# Patient Record
Sex: Female | Born: 2000 | Hispanic: No | Marital: Single | Smoking: Never smoker
Health system: Southern US, Community
[De-identification: ages and names within clinical notes are randomized; demographics above are authoritative.]

## PROBLEM LIST (undated history)

## (undated) DIAGNOSIS — R5600 Simple febrile convulsions: Secondary | ICD-10-CM

## (undated) DIAGNOSIS — E079 Disorder of thyroid, unspecified: Secondary | ICD-10-CM

## (undated) HISTORY — PX: WISDOM TOOTH EXTRACTION: SHX21

---
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
## 2021-01-07 ENCOUNTER — Other Ambulatory Visit: Payer: Self-pay | Admitting: Physician Assistant

## 2021-01-25 ENCOUNTER — Encounter: Payer: Self-pay | Admitting: Family Medicine

## 2021-01-25 ENCOUNTER — Other Ambulatory Visit: Payer: Self-pay

## 2021-01-25 ENCOUNTER — Ambulatory Visit (INDEPENDENT_AMBULATORY_CARE_PROVIDER_SITE_OTHER): Payer: Medicaid Other | Admitting: Family Medicine

## 2021-01-25 VITALS — BP 96/58 | HR 81 | Ht 60.0 in | Wt 114.0 lb

## 2021-01-25 DIAGNOSIS — G44209 Tension-type headache, unspecified, not intractable: Secondary | ICD-10-CM

## 2021-01-25 HISTORY — DX: Tension-type headache, unspecified, not intractable: G44.209

## 2021-01-25 NOTE — Patient Instructions (Signed)
Thank you for coming to see me today. It was a pleasure. Today we talked about:   You should get your eyes checked.  Try some meditation and relaxation exercises.  Come back if you are having continued pelvic pain.  If you have any questions or concerns, please do not hesitate to call the office at [phone].  Best,   Luis Abed, DO

## 2021-01-25 NOTE — Assessment & Plan Note (Signed)
Headaches could be related to hormonal changes in the setting of birth control, but patient reports that they are not worrisome to her and she doesn't feel that she needs anything currently.  Her exam is very reassuring.  Doubt that mood is contributing, her PHQ-9 positive answers are more related to fatigue and eating.  She is due for a thyroid function check with her endocrinologist, should f/u for this.  Also discussed getting her eyes checked.  Most of her headaches are related to being and work and she states that the lighting is very bright in there, so could also consider environmental triggers.  If she would like some assistance with this, could start with magnesium supplementation for headache prevention, but she declines treatment at this time.

## 2021-01-25 NOTE — Progress Notes (Signed)
    SUBJECTIVE:   CHIEF COMPLAINT / HPI:   Headaches after starting OCPs Started Loestrin in January Has been taking daily Started having headaches a few weeks Has been happening every day Before starting the pills, she also had headaches, that were when she got too hot, sometimes around her period Pain is in the front of her head, sometimes goes to the sides Feels like a squeezing and ache Usually last 15-45min, sometimes takes tylenol or ibuprofen which usually helps No nausea, phonophobia Occasional photophobia No positional changes No changes in vision Headaches feel the same as before, but maybe occasionally worse Usually happen when she goes to work, occasionally at home Goes to work at [employer] Only smokes THC, no tobacco use Periods are lighter with OCPs No SI, mostly appetite and trouble sleeping Had glasses in 3rd grade but was told she didn't need them, hasn't had them checked in [employer] Office Visit from 01/25/2021 in [facility]  PHQ-9 Total Score 7     Follows with Peds Endo at [facility], has appointment in May Last TSH in Feb 2021, TSH and T4 WNL at that time Still taking synthroid as directed  PERTINENT  PMH / PSH: Hypothyroidism, GERD  OBJECTIVE:   BP (!) 96/58   Pulse 81   Ht 5' (1.524 m)   Wt 114 lb (51.7 kg)   LMP 01/19/2021   SpO2 99%   BMI 22.26 kg/m    Physical Exam:  General: 20 y.o. female in NAD HEENT: PERRL, EOMI Lungs: Breathing comfortably on RA Skin: warm and dry Extremities: No edema, 5/5 strength BUE/BLE Neuro: CN II-XII grossly intact, sensation intact throughout   ASSESSMENT/PLAN:   Tension headache Headaches could be related to hormonal changes in the setting of birth control, but patient reports that they are not worrisome to her and she doesn't feel that she needs anything currently.  Her exam is very reassuring.  Doubt that mood is contributing, her PHQ-9 positive answers are more related to  fatigue and eating.  She is due for a thyroid function check with her endocrinologist, should f/u for this.  Also discussed getting her eyes checked.  Most of her headaches are related to being and work and she states that the lighting is very bright in there, so could also consider environmental triggers.  If she would like some assistance with this, could start with magnesium supplementation for headache prevention, but she declines treatment at this time.   At the end of visit, mentioned cramping with initiation of intercourse.  No change in partners and had recent negative STD screening.  She also reports that she has just recently started having intercourse for the first time.  Discussed relaxation techniques and relaxation.  Can f/u if no improvement over the next few weeks.  Could consider pelvic PT if has normal exam.  Again, her mood seems to be okay, doubt contribution.    Unknown Jim, DO [facility]

## 2021-04-14 ENCOUNTER — Other Ambulatory Visit: Payer: Self-pay | Admitting: Physician Assistant

## 2021-05-31 ENCOUNTER — Ambulatory Visit: Payer: Medicaid Other | Admitting: Family Medicine

## 2021-09-02 ENCOUNTER — Other Ambulatory Visit: Payer: Self-pay | Admitting: Physician Assistant

## 2021-09-11 ENCOUNTER — Encounter: Payer: Self-pay | Admitting: Family Medicine

## 2021-09-11 ENCOUNTER — Other Ambulatory Visit: Payer: Self-pay

## 2021-09-11 ENCOUNTER — Ambulatory Visit (INDEPENDENT_AMBULATORY_CARE_PROVIDER_SITE_OTHER): Payer: Medicaid Other | Admitting: Family Medicine

## 2021-09-11 DIAGNOSIS — R0602 Shortness of breath: Secondary | ICD-10-CM

## 2021-09-11 HISTORY — DX: Shortness of breath: R06.02

## 2021-09-11 MED ORDER — HYDROXYZINE PAMOATE 25 MG PO CAPS: 25.0000 mg | ORAL_CAPSULE | Freq: Every evening | ORAL | 1 refills | Status: DC | PRN

## 2021-09-11 NOTE — Assessment & Plan Note (Signed)
Sensation of sOB when laying down at night.  No evidence of heart or lung problem.  Not sleep apnea, in that she is awake with these sx.   Is on OCPs but that is only risk factor for PE.  Do not feel I should WU further. She agrees to try anxiolytic before bed.  If this works and if she wants long term meds, likely switch to SSRI.

## 2021-09-11 NOTE — Progress Notes (Signed)
    SUBJECTIVE:   CHIEF COMPLAINT / HPI:   Shortness of breath when laying down at night.  Strong family hx of sleep apnea.  No personal hx of asthma, other lung problems, heart disease or DVT.  Does state mild DOE during the day.  Mild allergic rhinitis sx.  On OCPs.  No recent surg, trauma, prolonged immobilization, or leg swelling. Does endorse anxiety and feels it may be playing a roll.  Not on any anxiolytic meds.      OBJECTIVE:   BP 113/69   Pulse 88   Wt 108 lb 6.4 oz (49.2 kg)   SpO2 100%   BMI 21.17 kg/m   Lungs clear Cardiac RRR without m or g Legs, no edema Had her walk around clinic till SOB.  Repeat pulse ox still 100%.  No wheeze  ASSESSMENT/PLAN:   Shortness of breath Sensation of sOB when laying down at night.  No evidence of heart or lung problem.  Not sleep apnea, in that she is awake with these sx.   Is on OCPs but that is only risk factor for PE.  Do not feel I should WU further. She agrees to try anxiolytic before bed.  If this works and if she wants long term meds, likely switch to SSRI.     Moses Manners, MD [facility]

## 2021-09-11 NOTE — Patient Instructions (Signed)
You do not have sleep apnea. I would be shocked if you had asthma or other important lung problem You have a normal sounding heart. My best guess is anxiety with a small component of allergies. You can use over the counter flonase for a stuffy nose. Try the anxiety med 30 minutes before bed to see if it works. If anxiety is a long-term problem, I would want you on a long term med. Of course, come back if the shortness of breath continues to get worse.

## 2021-10-04 ENCOUNTER — Other Ambulatory Visit: Payer: Self-pay | Admitting: Family Medicine

## 2021-10-04 DIAGNOSIS — R0602 Shortness of breath: Secondary | ICD-10-CM

## 2021-11-03 ENCOUNTER — Other Ambulatory Visit: Payer: Self-pay | Admitting: Family Medicine

## 2021-11-04 ENCOUNTER — Other Ambulatory Visit: Payer: Self-pay | Admitting: Family Medicine

## 2021-11-04 DIAGNOSIS — R0602 Shortness of breath: Secondary | ICD-10-CM

## 2021-11-26 ENCOUNTER — Other Ambulatory Visit: Payer: Self-pay | Admitting: Family Medicine

## 2021-11-26 DIAGNOSIS — R0602 Shortness of breath: Secondary | ICD-10-CM

## 2021-12-23 ENCOUNTER — Other Ambulatory Visit: Payer: Self-pay | Admitting: Family Medicine

## 2021-12-23 DIAGNOSIS — R0602 Shortness of breath: Secondary | ICD-10-CM

## 2021-12-25 ENCOUNTER — Ambulatory Visit (INDEPENDENT_AMBULATORY_CARE_PROVIDER_SITE_OTHER): Payer: Medicaid Other | Admitting: Family Medicine

## 2021-12-25 ENCOUNTER — Encounter: Payer: Self-pay | Admitting: Family Medicine

## 2021-12-25 ENCOUNTER — Other Ambulatory Visit (HOSPITAL_COMMUNITY)
Admission: RE | Admit: 2021-12-25 | Discharge: 2021-12-25 | Disposition: A | Discharge status: Home / Self Care | Payer: Medicaid Other | Source: Ambulatory Visit | Attending: Family Medicine | Admitting: Family Medicine

## 2021-12-25 ENCOUNTER — Other Ambulatory Visit: Payer: Self-pay

## 2021-12-25 VITALS — BP 112/71 | HR 76 | Wt 109.0 lb

## 2021-12-25 DIAGNOSIS — Z3200 Encounter for pregnancy test, result unknown: Secondary | ICD-10-CM

## 2021-12-25 DIAGNOSIS — Z113 Encounter for screening for infections with a predominantly sexual mode of transmission: Secondary | ICD-10-CM

## 2021-12-25 DIAGNOSIS — Z3009 Encounter for other general counseling and advice on contraception: Secondary | ICD-10-CM

## 2021-12-25 LAB — POCT WET PREP (WET MOUNT)
Clue Cells Wet Prep Whiff POC: NEGATIVE
Trichomonas Wet Prep HPF POC: ABSENT

## 2021-12-25 LAB — POCT URINE PREGNANCY: Preg Test, Ur: NEGATIVE

## 2021-12-25 NOTE — Patient Instructions (Addendum)
It was wonderful to see you today. Thank you for allowing me to be a part of your care. Below is a short summary of what we discussed at your visit today:  STI screening - Today we obtained a vaginal swab to screen for gonorrhea, chlamydia, and trichomonas - We also obtained a blood sample to screen for HIV and syphilis - As above, if the results are normal, I will send you a letter or MyChart message. If the results are abnormal, I will give you a call.     Back pain Please schedule appointment to come back and talk about your back pain.  This can be as soon as possible with your PCP, at any time with any provider, or in our access to care clinic which functions like a same-day/next day clinic.  Health Maintenance We like to think about ways to keep you healthy for years to come. Below are some interventions and screenings we can offer to keep you healthy: - influenza vaccine  - HPV vaccine (3 shot series that protects against cervical cancer)   Please bring all of your medications to every appointment!  If you have any questions or concerns, please do not hesitate to contact us via phone or MyChart message.   Fayette Pho, MD

## 2021-12-25 NOTE — Assessment & Plan Note (Signed)
Patient reports stopping her OCPs due to side effects.  She declines restart at this time.  Offered to discuss this in the future, including other options.  At this time she elects to use condoms.

## 2021-12-25 NOTE — Assessment & Plan Note (Addendum)
Pregnancy test negative.  Vaginal swab for GC, chlamydia, trichomonas, BV today.  Blood sample for HIV, RPR.  We will follow-up with results.

## 2021-12-25 NOTE — Progress Notes (Signed)
° ° °  SUBJECTIVE:   CHIEF COMPLAINT / HPI:   STI screen, pregnancy test - Patient presents today for STI screening - No abnormal discharge, bleeding, pain, odor, rashes - No concern for specific exposures - Also reports being 7 days late for menses, requests urine pregnancy test  PERTINENT  PMH / PSH:  Patient Active Problem List   Diagnosis Date Noted   Birth control counseling 12/11/2020   Screening examination for STD (sexually transmitted disease) 12/11/2020   Hypothyroidism 12/09/2020   GERD (gastroesophageal reflux disease) 12/09/2020    OBJECTIVE:   BP 112/71    Pulse 76    Wt 109 lb (49.4 kg)    LMP 11/29/2020    SpO2 100%    BMI 21.29 kg/m   Physical Exam General: Awake, alert, oriented, no acute distress Respiratory: Normal work of breathing, no respiratory distress Neuro: Cranial nerves II through X grossly intact, able to move all extremities spontaneously Vulva: Normal appearing vulva without rashes, lesions, or deformities Vagina: Pale pink rugated vaginal tissue without obvious lesions, physiologic discharge of whitish color, cervix with scant blood in os  ASSESSMENT/PLAN:   Screening examination for STD (sexually transmitted disease) Pregnancy test negative.  Vaginal swab for GC, chlamydia, trichomonas, BV today.  Blood sample for HIV, RPR.  We will follow-up with results.  Birth control counseling Patient reports stopping her OCPs due to side effects.  She declines restart at this time.  Offered to discuss this in the future, including other options.  At this time she elects to use condoms.     Ezequiel Essex, MD [place]

## 2021-12-26 LAB — HIV ANTIBODY (ROUTINE TESTING W REFLEX): HIV Screen 4th Generation wRfx: NONREACTIVE

## 2021-12-26 LAB — RPR: RPR Ser Ql: NONREACTIVE
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
## 2021-12-26 NOTE — Patient Instructions (Addendum)
It was wonderful to see you today.  Please bring ALL of your medications with you to every visit.   Today we talked about:  -I think your back pain is related to your muscles. We discussed proper lifting techniques today. -You can continue to use heat as needed. -Apply voltaren gel to the area as needed as well. Don't use heat over the gel. -Return if not improving or worsening, we may consider physical therapy at that time.  -I don't think that we need to get x-rays at this time.    Thank you for choosing [facility].   Please call [phone] with any questions about today's appointment.  Please be sure to schedule follow up at the front  desk before you leave today.   Sherry Dick, DO PGY-2 Family Medicine    Back Exercises These exercises help to make your trunk and back strong. They also help to keep the lower back flexible. Doing these exercises can help to prevent or lessen pain in your lower back. If you have back pain, try to do these exercises 2-3 times each day or as told by your doctor. As you get better, do the exercises once each day. Repeat the exercises more often as told by your doctor. To stop back pain from coming back, do the exercises once each day, or as told by your doctor. Do exercises exactly as told by your doctor. Stop right away if you feel sudden pain or your pain gets worse. Exercises Single knee to chest Do these steps 3-5 times in a row for each leg: Lie on your back on a firm bed or the floor with your legs stretched out. Bring one knee to your chest. Grab your knee or thigh with both hands and hold it in place. Pull on your knee until you feel a gentle stretch in your lower back or butt. Keep doing the stretch for 10-30 seconds. Slowly let go of your leg and straighten it. Pelvic tilt Do these steps 5-10 times in a row: Lie on your back on a firm bed or the floor with your legs stretched out. Bend your knees so they  point up to the ceiling. Your feet should be flat on the floor. Tighten your lower belly (abdomen) muscles to press your lower back against the floor. This will make your tailbone point up to the ceiling instead of pointing down to your feet or the floor. Stay in this position for 5-10 seconds while you gently tighten your muscles and breathe evenly. Cat-cow Do these steps until your lower back bends more easily: Get on your hands and knees on a firm bed or the floor. Keep your hands under your shoulders, and keep your knees under your hips. You may put padding under your knees. Let your head hang down toward your chest. Tighten (contract) the muscles in your belly. Point your tailbone toward the floor so your lower back becomes rounded like the back of a cat. Stay in this position for 5 seconds. Slowly lift your head. Let the muscles of your belly relax. Point your tailbone up toward the ceiling so your back forms a sagging arch like the back of a cow. Stay in this position for 5 seconds.  Press-ups Do these steps 5-10 times in a row: Lie on your belly (face-down) on a firm bed or the floor. Place your hands near your head, about shoulder-width apart. While you keep your back relaxed and keep your hips on  the floor, slowly straighten your arms to raise the top half of your body and lift your shoulders. Do not use your back muscles. You may change where you place your hands to make yourself more comfortable. Stay in this position for 5 seconds. Keep your back relaxed. Slowly return to lying flat on the floor.  Bridges Do these steps 10 times in a row: Lie on your back on a firm bed or the floor. Bend your knees so they point up to the ceiling. Your feet should be flat on the floor. Your arms should be flat at your sides, next to your body. Tighten your butt muscles and lift your butt off the floor until your waist is almost as high as your knees. If you do not feel the muscles working in your  butt and the back of your thighs, slide your feet 1-2 inches (2.5-5 cm) farther away from your butt. Stay in this position for 3-5 seconds. Slowly lower your butt to the floor, and let your butt muscles relax. If this exercise is too easy, try doing it with your arms crossed over your chest. Belly crunches Do these steps 5-10 times in a row: Lie on your back on a firm bed or the floor with your legs stretched out. Bend your knees so they point up to the ceiling. Your feet should be flat on the floor. Cross your arms over your chest. Tip your chin a little bit toward your chest, but do not bend your neck. Tighten your belly muscles and slowly raise your chest just enough to lift your shoulder blades a tiny bit off the floor. Avoid raising your body higher than that because it can put too much stress on your lower back. Slowly lower your chest and your head to the floor. Back lifts Do these steps 5-10 times in a row: Lie on your belly (face-down) with your arms at your sides, and rest your forehead on the floor. Tighten the muscles in your legs and your butt. Slowly lift your chest off the floor while you keep your hips on the floor. Keep the back of your head in line with the curve in your back. Look at the floor while you do this. Stay in this position for 3-5 seconds. Slowly lower your chest and your face to the floor. Contact a doctor if: Your back pain gets a lot worse when you do an exercise. Your back pain does not get better within 2 hours after you exercise. If you have any of these problems, stop doing the exercises. Do not do them again unless your doctor says it is okay. Get help right away if: You have sudden, very bad back pain. If this happens, stop doing the exercises. Do not do them again unless your doctor says it is okay. This information is not intended to replace advice given to you by your health care provider. Make sure you discuss any questions you have with your health  care provider. Document Revised: 01/18/2021 Document Reviewed: 01/18/2021 Elsevier Patient Education  2022 [employer].

## 2021-12-27 ENCOUNTER — Other Ambulatory Visit: Payer: Self-pay

## 2021-12-27 ENCOUNTER — Ambulatory Visit (INDEPENDENT_AMBULATORY_CARE_PROVIDER_SITE_OTHER): Payer: Medicaid Other | Admitting: Family Medicine

## 2021-12-27 DIAGNOSIS — M546 Pain in thoracic spine: Secondary | ICD-10-CM

## 2021-12-27 DIAGNOSIS — M549 Dorsalgia, unspecified: Secondary | ICD-10-CM | POA: Insufficient documentation

## 2021-12-27 LAB — CERVICOVAGINAL ANCILLARY ONLY
Bacterial Vaginitis (gardnerella): NEGATIVE
Chlamydia: NEGATIVE
Comment: NEGATIVE
Comment: NEGATIVE
Comment: NEGATIVE
Comment: NORMAL
Neisseria Gonorrhea: NEGATIVE
Trichomonas: NEGATIVE

## 2021-12-27 MED ORDER — DICLOFENAC SODIUM 1 % EX GEL: 4.0000 g | Freq: Four times a day (QID) | CUTANEOUS | 1 refills | Status: DC

## 2021-12-27 NOTE — Assessment & Plan Note (Signed)
Appears that her back pain is more so related to improper lifting.  Though she switched departments in the last month, she does note that she was previously lifting frequently at work.  She demonstrated her lifting technique which was improper and included the use of back muscles primarily.  We discussed proper lifting techniques.  At this time, I do not feel that imaging is indicated.  She has had some relief with heat application, recommended that she continue this. Examination remarkable only for paraspinal tenderness in mid-thoracic and lumbar regions, otherwise negative with good strength and sensation. No concern for more serious pathology at this time. -Avoid heavy lifting -Apply heat to area of back several times a day -Can use voltaren gel as needed to area (avoid using with heat) -If no improvement or if worsening, can consider referral to PT -No imaging at this time

## 2022-01-26 ENCOUNTER — Other Ambulatory Visit: Payer: Self-pay

## 2022-01-26 ENCOUNTER — Ambulatory Visit: Payer: Medicaid Other

## 2022-01-26 ENCOUNTER — Ambulatory Visit (INDEPENDENT_AMBULATORY_CARE_PROVIDER_SITE_OTHER): Payer: Medicaid Other

## 2022-01-26 DIAGNOSIS — Z23 Encounter for immunization: Secondary | ICD-10-CM

## 2022-01-26 NOTE — Progress Notes (Signed)
Patient presents to nurse clinic for flu vaccination. Administered in LD, site unremarkable. Tolerated injection well.   Baylor Cortez C Flor Houdeshell, RN  

## 2022-04-05 ENCOUNTER — Ambulatory Visit: Payer: Medicaid Other | Admitting: Family Medicine

## 2022-04-06 ENCOUNTER — Encounter: Payer: Self-pay | Admitting: Family Medicine

## 2022-04-10 ENCOUNTER — Encounter: Payer: Self-pay | Admitting: Family Medicine

## 2022-04-10 ENCOUNTER — Other Ambulatory Visit: Payer: Self-pay

## 2022-04-10 ENCOUNTER — Ambulatory Visit (INDEPENDENT_AMBULATORY_CARE_PROVIDER_SITE_OTHER): Payer: Medicaid Other | Admitting: Family Medicine

## 2022-04-10 VITALS — BP 113/58 | HR 83 | Wt 112.2 lb

## 2022-04-10 DIAGNOSIS — Z3009 Encounter for other general counseling and advice on contraception: Secondary | ICD-10-CM

## 2022-04-10 DIAGNOSIS — F419 Anxiety disorder, unspecified: Secondary | ICD-10-CM

## 2022-04-10 DIAGNOSIS — Z309 Encounter for contraceptive management, unspecified: Secondary | ICD-10-CM

## 2022-04-10 DIAGNOSIS — R21 Rash and other nonspecific skin eruption: Secondary | ICD-10-CM | POA: Diagnosis not present

## 2022-04-10 LAB — POCT URINE PREGNANCY: Preg Test, Ur: NEGATIVE

## 2022-04-10 MED ORDER — BUSPIRONE HCL 7.5 MG PO TABS: 7.5000 mg | ORAL_TABLET | Freq: Three times a day (TID) | ORAL | 0 refills | Status: DC

## 2022-04-10 MED ORDER — ESCITALOPRAM OXALATE 10 MG PO TABS: 10.0000 mg | ORAL_TABLET | Freq: Every day | ORAL | 0 refills | Status: DC

## 2022-04-10 MED ORDER — MEDROXYPROGESTERONE ACETATE 150 MG/ML IM SUSY
150.0000 mg | PREFILLED_SYRINGE | Freq: Once | INTRAMUSCULAR | Status: AC
  Administered 2022-04-10: 150 mg via INTRAMUSCULAR

## 2022-04-10 MED ORDER — MEDROXYPROGESTERONE ACETATE 150 MG/ML IM SUSP: 150.0000 mg | Freq: Once | INTRAMUSCULAR | Status: DC

## 2022-04-10 NOTE — Progress Notes (Signed)
     SUBJECTIVE:   CHIEF COMPLAINT / HPI:   Sherry Santos is a 21 y.o. female presents for follow up  Anxiety  Pt endorses anxiety for a while but has got worse  recently. Has tried hydroxyzine in the past but makes her sleepy. It gets worse at night and during the day in public. Has been feeling nauseous a lot. Took pregnancy test which was negative. She works at [employer] which she finds stressful, her relationship with her family also make her anxious.  Birth control counseling LMP: May 5th. Sexually active with new boyfriend. Used to be on OCPs but it made her depressed in 2021 and she lost weight. She has is using condoms regularly.   Rashes over legs Whenever she takes shower at her moms house she gets a red and bumpy rash over her legs. It is initially itchy which then goes away. No fx of bleeding disorders.   [place] Office Visit from 04/10/2022 in [place]  PHQ-9 Total Score 6       PERTINENT  PMH / PSH: hypothyroidism, GERD  OBJECTIVE:   BP (!) 113/58   Pulse 83   Wt 112 lb 3.2 oz (50.9 kg)   SpO2 99%   BMI 21.91 kg/m    General: Alert, no acute distress Cardio: well perfused  Pulm:normal work of breathing Neuro: Cranial nerves grossly intact        ASSESSMENT/PLAN:   Birth control counseling Pt stopped OCPs a few months ago due to side effect. She is sexually active with new partners and wants to start depo. No contraindications. Counseled on side effects. Urine preg. Pt received depo today. Follow up in 3 months for depo.   Maculopapular rash New maculopapular rash over thighs. No signs of bleeding mucous membranes, hematemesis, hematochezia, melena etc. No recent infections, no new medications started recently.Broad differentials include ITP, TTP, HUS, other etc.  Obtained CBC with diff to check platelets, CMP, HIV and Hep c. Recommended close observation of rash for now until labs are back and to keep an eye for any  triggers. Follow up in 1-2 weeks with me in 2 weeks.  Strict ER precautions given to pt.  Anxiety Uncontrolled anxiety, predominantly in social settings. Discussed starting SSRI and buspar. Pt is open to this. No hx of mania. Started lexapro 10mg  daily and buspar 7.5mg  TID. Follow up with me in 2 weeks.     Lattie Haw, MD PGY-3 [place]

## 2022-04-10 NOTE — Patient Instructions (Addendum)
For anxiety-start taking lexapro   Unsure what the rash is but it could be a disorder of the blood. I am doing some regular lab work.   Therapy and Counseling Resources Most providers on this list will take Medicaid. Patients with commercial insurance or Medicare should contact their insurance company to get a list of in network providers.  [facility] (spanish speaking therapist available)(habla espanol)(take medicare and medicaid)  [place], [place], [place] [address], [place] [email or web address] [phone]  [facility] [address] [place], [place] [address] [place], [place], [place] [address]      [phone]  [facility] (spanish available) [place], [place] [address] [place] (take [facility] and medicare) [address], [place], [place] [address]       [phone]     [place] (virtual only) [phone]  [facility]inny [facility] [address] [place], [place]  [facility]:  [place]. [place] [phone]  [facility]:  [place] [address] [phone]  [facility] (under & uninsured) [address], [place] [phone]    [email or web address]    [place] [address]  [place]    [phone]  [facility]     Phone:  [phone]     Emmet Paris  [place] [address]      [place], [place] ext [place]: [place], [place], [place]   [place] ([place] therapist) [email or web address]  [place] [address]   [place] [address]     [phone]    [facility]   [address],  [place], [place]   [place]  [place]  [facility]  [address] [place], [place]        [phone]  Open Access/Walk In Clinic under & uninsured  [facility]  [address] [place], [place] Crisis [phone]  [facility] [place],  ([place])   [place], [place]: [phone]) 8:30 - 12; 1 - 2:30  [facility] [place],  [place], [place]    [phone]):8:30 - 12; 2 - 3PM  [facility],  [address],  [place]; [phone]):   Mon - Fri 8 AM - 5 PM  [facility] [place]  MWF 12:30 to 3:00 or call to schedule an appointment  [phone]  Specific Provider options Psychology Today  [email or web address] click on find a therapist  enter your zip code left side and select or tailor a therapist for your specific need.   [facility] Provider Directory [email or web address]  (Medicaid)   Follow all drop down to find a provider  [place] or [email or web address] [address], [place], [place] Recovery support and educational   24- Hour Availability:   [facility]  [address] [place], [place] Crisis [phone]  [facility] [employer]  [place]  [phone]   [place]  [phone] (after hours)  [facility]/Mobile Crisis   [phone]  [place] National Suicide Hotline  [phone] Diamantina Monks)  Call 911 or go to emergency room  [facility]  [phone]);  [place] and [employer]  [phone]); [place],  [place], [place], [place], [place], [place], [place]

## 2022-04-11 ENCOUNTER — Encounter: Payer: Self-pay | Admitting: Family Medicine

## 2022-04-11 LAB — CBC WITH DIFFERENTIAL/PLATELET
Basophils Absolute: 0 10*3/uL (ref 0.0–0.2)
Basos: 1 %
EOS (ABSOLUTE): 0.2 10*3/uL (ref 0.0–0.4)
Eos: 2 %
Hematocrit: 40.2 % (ref 34.0–46.6)
Hemoglobin: 13 g/dL (ref 11.1–15.9)
Immature Grans (Abs): 0 10*3/uL (ref 0.0–0.1)
Immature Granulocytes: 0 %
Lymphocytes Absolute: 2.4 10*3/uL (ref 0.7–3.1)
Lymphs: 28 %
MCH: 28.1 pg (ref 26.6–33.0)
MCHC: 32.3 g/dL (ref 31.5–35.7)
MCV: 87 fL (ref 79–97)
Monocytes Absolute: 0.6 10*3/uL (ref 0.1–0.9)
Monocytes: 7 %
Neutrophils Absolute: 5.4 10*3/uL (ref 1.4–7.0)
Neutrophils: 62 %
Platelets: 288 10*3/uL (ref 150–450)
RBC: 4.63 x10E6/uL (ref 3.77–5.28)
RDW: 12.2 % (ref 11.7–15.4)
WBC: 8.6 10*3/uL (ref 3.4–10.8)

## 2022-04-11 LAB — COMPREHENSIVE METABOLIC PANEL
ALT: 9 IU/L (ref 0–32)
AST: 18 IU/L (ref 0–40)
Albumin/Globulin Ratio: 1.5 (ref 1.2–2.2)
Albumin: 4.7 g/dL (ref 3.9–5.0)
Alkaline Phosphatase: 49 IU/L (ref 42–106)
BUN/Creatinine Ratio: 8 — ABNORMAL LOW (ref 9–23)
BUN: 6 mg/dL (ref 6–20)
Bilirubin Total: 0.2 mg/dL (ref 0.0–1.2)
CO2: 25 mmol/L (ref 20–29)
Calcium: 9.8 mg/dL (ref 8.7–10.2)
Chloride: 102 mmol/L (ref 96–106)
Creatinine, Ser: 0.77 mg/dL (ref 0.57–1.00)
Globulin, Total: 3.1 g/dL (ref 1.5–4.5)
Glucose: 91 mg/dL (ref 70–99)
Potassium: 4 mmol/L (ref 3.5–5.2)
Sodium: 140 mmol/L (ref 134–144)
Total Protein: 7.8 g/dL (ref 6.0–8.5)
eGFR: 113 mL/min/{1.73_m2} (ref >59)

## 2022-04-11 LAB — HEPATITIS C ANTIBODY: Hep C Virus Ab: NONREACTIVE

## 2022-04-11 LAB — HIV ANTIBODY (ROUTINE TESTING W REFLEX): HIV Screen 4th Generation wRfx: NONREACTIVE

## 2022-04-16 DIAGNOSIS — R21 Rash and other nonspecific skin eruption: Secondary | ICD-10-CM

## 2022-04-16 DIAGNOSIS — F419 Anxiety disorder, unspecified: Secondary | ICD-10-CM | POA: Insufficient documentation

## 2022-04-16 HISTORY — DX: Rash and other nonspecific skin eruption: R21

## 2022-04-16 NOTE — Assessment & Plan Note (Signed)
Uncontrolled anxiety, predominantly in social settings. Discussed starting SSRI and buspar. Pt is open to this. No hx of mania. Started lexapro 10mg  daily and buspar 7.5mg  TID. Follow up with me in 2 weeks.

## 2022-04-16 NOTE — Assessment & Plan Note (Signed)
New maculopapular rash over thighs. No signs of bleeding mucous membranes, hematemesis, hematochezia, melena etc. No recent infections, no new medications started recently.Broad differentials include ITP, TTP, HUS, other etc.  Obtained CBC with diff to check platelets, CMP, HIV and Hep c. Recommended close observation of rash for now until labs are back and to keep an eye for any triggers. Follow up in 1-2 weeks with me in 2 weeks.  Strict ER precautions given to pt.

## 2022-04-16 NOTE — Assessment & Plan Note (Signed)
Pt stopped OCPs a few months ago due to side effect. She is sexually active with new partners and wants to start depo. No contraindications. Counseled on side effects. Urine preg. Pt received depo today. Follow up in 3 months for depo.

## 2022-04-18 ENCOUNTER — Encounter: Payer: Self-pay | Admitting: Family Medicine

## 2022-04-20 ENCOUNTER — Encounter: Payer: Self-pay | Admitting: Family Medicine

## 2022-04-23 ENCOUNTER — Other Ambulatory Visit: Payer: Self-pay

## 2022-04-23 ENCOUNTER — Encounter (HOSPITAL_COMMUNITY): Payer: Self-pay | Admitting: *Deleted

## 2022-04-23 ENCOUNTER — Ambulatory Visit (HOSPITAL_COMMUNITY)
Admission: EM | Admit: 2022-04-23 | Discharge: 2022-04-23 | Disposition: A | Discharge status: Home / Self Care | Payer: Medicaid Other | Attending: Sports Medicine | Admitting: Sports Medicine

## 2022-04-23 DIAGNOSIS — D692 Other nonthrombocytopenic purpura: Secondary | ICD-10-CM | POA: Insufficient documentation

## 2022-04-23 DIAGNOSIS — R21 Rash and other nonspecific skin eruption: Secondary | ICD-10-CM | POA: Diagnosis present

## 2022-04-23 LAB — SEDIMENTATION RATE: Sed Rate: 7 mm/h (ref 0–22)

## 2022-04-23 LAB — C-REACTIVE PROTEIN: CRP: 0.5 mg/dL

## 2022-04-23 MED ORDER — PREDNISONE 20 MG PO TABS: 20.0000 mg | ORAL_TABLET | Freq: Every day | ORAL | 0 refills | Status: AC

## 2022-04-23 NOTE — ED Provider Notes (Signed)
[place]    CSN: [id number] Arrival date & time: 04/23/22  0805      History   Chief Complaint Chief Complaint  Patient presents with   Rash    HPI Sherry Santos is a 21 y.o. female here for rash   Rash Associated symptoms: no abdominal pain, no fever and no shortness of breath    Third time she has had same rash First time was a few weeks ago Rash on lower extremities and feet only It is itchy when it pops up initially, but then pruritis goes away and somewhat painful The rash will go away on it's own after 1-2 days Starts as bright red, then fades to purple, then goes away No joint pain No systemic symptoms of fever or chills Denies any abdominal pain or change in urinary frequency or color Denies any personal or family history of autoimmune conditions such as rheumatoid, psoriatic arthritis, lupus, etc. Patient does have a thyroid disorder    Past Medical History:  Diagnosis Date   Febrile seizures ([place])    Shortness of breath 09/11/2021   Tension headache 01/25/2021   Thyroid disease    Thyroid disorder     Patient Active Problem List   Diagnosis Date Noted   Anxiety 04/16/2022   Maculopapular rash 04/16/2022   Back pain 12/27/2021   Birth control counseling 12/11/2020   Screening examination for STD (sexually transmitted disease) 12/11/2020   Hypothyroidism 12/09/2020   GERD (gastroesophageal reflux disease) 12/09/2020    Past Surgical History:  Procedure Laterality Date   WISDOM TOOTH EXTRACTION      OB History   No obstetric history on file.      Home Medications    Prior to Admission medications   Medication Sig Start Date End Date Taking? Authorizing Provider  predniSONE (DELTASONE) 20 MG tablet Take 1 tablet (20 mg total) by mouth daily with breakfast for 7 days. 04/23/22 04/30/22 Yes Elba Barman, DO  busPIRone (BUSPAR) 7.5 MG tablet Take 1 tablet (7.5 mg total) by mouth 3 (three) times daily. 04/10/22 05/10/22  Lattie Haw, MD  escitalopram (LEXAPRO) 10 MG tablet Take 1 tablet (10 mg total) by mouth daily. 04/10/22 05/10/22  Lattie Haw, MD  hydrOXYzine (VISTARIL) 25 MG capsule TAKE 1 CAPSULE (25 MG TOTAL) BY MOUTH AT BEDTIME AS NEEDED. 12/25/21   Lattie Haw, MD  levothyroxine (SYNTHROID) 88 MCG tablet Take by mouth. 07/20/20   [provider]  ipratropium (ATROVENT) 0.06 % nasal spray Place 2 sprays into both nostrils 4 (four) times daily. 08/22/16 09/07/19  Billy Fischer, MD    Family History Family History  Problem Relation Age of Onset   Seizures Mother    Thyroid disease Mother    Heart disease Mother    Gallbladder disease Mother    Diabetes Father    Heart disease Sister    Pancreatic cancer Other    Stomach cancer Neg Hx    Colon cancer Neg Hx    Esophageal cancer Neg Hx     Social History Social History   Tobacco Use   Smoking status: Never    Passive exposure: Yes   Smokeless tobacco: Never  Vaping Use   Vaping Use: Never used  Substance Use Topics   Alcohol use: No   Drug use: No     Allergies   Patient has no known allergies.   Review of Systems Review of Systems  Constitutional:  Negative for chills, fever and unexpected weight  change.  Respiratory:  Negative for shortness of breath.   Cardiovascular:  Negative for chest pain.  Gastrointestinal:  Negative for abdominal pain.  Genitourinary:  Negative for dysuria.  Skin:  Positive for color change and rash.  Neurological:  Negative for dizziness and weakness.    Physical Exam Triage Vital Signs ED Triage Vitals  Enc Vitals Group     BP 04/23/22 0832 (!) 108/56     Pulse Rate 04/23/22 0832 80     Resp 04/23/22 0832 18     Temp 04/23/22 0832 99.2 F (37.3 C)     Temp src --      SpO2 04/23/22 0832 98 %     Weight --      Height --      Head Circumference --      Peak Flow --      Pain Score 04/23/22 0834 6     Pain Loc --      Pain Edu? --      Excl. in [place]? --    No data found.  Updated  Vital Signs BP (!) 108/56   Pulse 80   Temp 99.2 F (37.3 C)   Resp 18   LMP 03/23/2022   SpO2 98%    Physical Exam Constitutional:      General: She is not in acute distress.    Appearance: Normal appearance. She is not ill-appearing.  HENT:     Head: Normocephalic and atraumatic.  Cardiovascular:     Rate and Rhythm: Normal rate.     Pulses: Normal pulses.  Pulmonary:     Effort: Pulmonary effort is normal. No respiratory distress.  Abdominal:     General: Abdomen is flat.     Palpations: Abdomen is soft.  Skin:    General: Skin is warm.     Capillary Refill: Capillary refill takes less than 2 seconds.     Findings: Rash (flat, purpuric rash scattered on thighs, shins, and left foot) present.  Neurological:     Mental Status: She is alert.  Psychiatric:        Mood and Affect: Mood normal.        Thought Content: Thought content normal.     UC Treatments / Results  Labs (all labs ordered are listed, but only abnormal results are displayed) Labs Reviewed  SEDIMENTATION RATE  C-REACTIVE PROTEIN    EKG   Radiology No results found.  Procedures Procedures (including critical care time)  Medications Ordered in UC Medications - No data to display  Initial Impression / Assessment and Plan / UC Course  I have reviewed the triage vital signs and the nursing notes.  Pertinent labs & imaging results that were available during my care of the patient were reviewed by me and considered in my medical decision making (see chart for details).     Patient presents with waxing and waning purpuric rash x 3 weeks.  Saw primary care physician for this on 04/10/2022 and had CBC, CMP, hepatitis C and HIV antibodies which were normal.  No associated anemia or kidney dysfunction.  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  She is older than would be expected for HSP.  She has no systemic symptoms of illness.  Does have history of thyroid condition, which  could be contributing autoimmune cause.  Will draw ESR and CRP today for baseline.  We will treat with prednisone 20 mg once daily for 7 days.  Ultimately, I  did discuss that the patient would likely need to see dermatology for official work-up, possible biopsy and treatment going forward.  She is agreeable to this, I did put in local dermatology for her to call and set up appointment.  Return precautions were provided here, otherwise follow-up with PCP and eventually dermatology.  Safe for discharge home. 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 Provider   predniSONE (DELTASONE) 20 MG tablet Take 1 tablet (20 mg total) by mouth daily with breakfast for 7 days. 7 tablet Elba Barman, DO      PDMP not reviewed this encounter.   Elba Barman, DO 04/23/22 1020

## 2022-04-23 NOTE — Discharge Instructions (Addendum)
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

## 2022-04-23 NOTE — ED Triage Notes (Signed)
Pt reports rash on legs and feet . Rash comes and goes for weeks.

## 2022-04-24 ENCOUNTER — Encounter: Payer: Self-pay | Admitting: *Deleted

## 2022-04-25 ENCOUNTER — Ambulatory Visit (INDEPENDENT_AMBULATORY_CARE_PROVIDER_SITE_OTHER): Payer: Medicaid Other | Admitting: Family Medicine

## 2022-04-25 ENCOUNTER — Encounter: Payer: Self-pay | Admitting: Family Medicine

## 2022-04-25 DIAGNOSIS — F419 Anxiety disorder, unspecified: Secondary | ICD-10-CM | POA: Diagnosis present

## 2022-04-25 NOTE — Progress Notes (Signed)
     SUBJECTIVE:   CHIEF COMPLAINT / HPI:   Sherry Santos is a 21 y.o. female presents for anxiety   Anxiety  Started buspar 7.5mg  TID and Lexapro 10mg  a few weeks ago. Pt reports significant improvement in anxiety since then. She feels less anxious in large crowds. She has not contacted a therapist yet..     04/25/2022    4:33 PM 04/10/2022    4:36 PM  GAD 7 : Generalized Anxiety Score  Nervous, Anxious, on Edge 1 3  Control/stop worrying 0 3  Worry too much - different things 1 3  Trouble relaxing 0 2  Restless 0 2  Easily annoyed or irritable 1 3  Afraid - awful might happen 0 3  Total GAD 7 Score 3 19  Anxiety Difficulty Somewhat difficult      Flowsheet Row Office Visit from 04/25/2022 in [facility]  PHQ-9 Total Score 6         PERTINENT  PMH / PSH: GERD, anxiety, hypothyroidism   OBJECTIVE:   BP 117/75   Pulse 68   Ht 5' (1.524 m)   Wt 110 lb 6.4 oz (50.1 kg)   LMP 03/23/2022   SpO2 100%   BMI 21.56 kg/m    General: Alert, no acute distress Cardio: well perfused Pulm: normal work of breathing.  Neuro: Cranial nerves grossly intact   ASSESSMENT/PLAN:   Anxiety Improved, stable. Offered pt increase in Lexapro dose today but she declined. Continue Lexapro 10mg  and Buspar 7.5mg  TID. Continue to recommend therapy.    05/23/2022, MD PGY-3 [facility]

## 2022-04-25 NOTE — Patient Instructions (Signed)
Thank you for coming to see me today. It was a pleasure. Today we discussed your anxiety, I am glad your symptoms have improved. I recommend continuing the same medications. Book follow up with a therapist.   F/u with dermatology for the rash   Please follow-up with me as needed  If you have any questions or concerns, please do not hesitate to call the office at [phone].  Best wishes,   Dr Allena Katz

## 2022-04-26 NOTE — Assessment & Plan Note (Addendum)
Improved, stable. Offered pt increase in Lexapro dose today but she declined. Continue Lexapro 10mg  and Buspar 7.5mg  TID. Continue to recommend therapy.

## 2022-05-03 ENCOUNTER — Other Ambulatory Visit: Payer: Self-pay | Admitting: Family Medicine

## 2022-06-26 ENCOUNTER — Ambulatory Visit (INDEPENDENT_AMBULATORY_CARE_PROVIDER_SITE_OTHER): Payer: Medicaid Other

## 2022-06-26 ENCOUNTER — Ambulatory Visit: Payer: Medicaid Other

## 2022-06-26 DIAGNOSIS — Z3042 Encounter for surveillance of injectable contraceptive: Secondary | ICD-10-CM | POA: Diagnosis not present

## 2022-06-26 MED ORDER — MEDROXYPROGESTERONE ACETATE 150 MG/ML IM SUSP
150.0000 mg | Freq: Once | INTRAMUSCULAR | Status: AC
  Administered 2022-06-26: 150 mg via INTRAMUSCULAR

## 2022-06-26 NOTE — Progress Notes (Signed)
Patient here today for Depo Provera injection and is within her dates.    Last contraceptive appt was 04/10/2022  Depo given in [place] today.  Site unremarkable & patient tolerated injection.    Next injection due 09/11/22-09/25/2022.  Reminder card given.    Veronda Prude, RN

## 2022-09-24 ENCOUNTER — Ambulatory Visit (INDEPENDENT_AMBULATORY_CARE_PROVIDER_SITE_OTHER): Payer: Medicaid Other

## 2022-09-24 DIAGNOSIS — Z3042 Encounter for surveillance of injectable contraceptive: Secondary | ICD-10-CM

## 2022-09-24 MED ORDER — MEDROXYPROGESTERONE ACETATE 150 MG/ML IM SUSP
150.0000 mg | Freq: Once | INTRAMUSCULAR | Status: AC
  Administered 2022-09-24: 150 mg via INTRAMUSCULAR

## 2022-09-24 NOTE — Progress Notes (Signed)
Patient here today for Depo Provera injection and is within her dates.    Last contraceptive appt was 04/10/22  Depo given in [place] today.  Site unremarkable & patient tolerated injection.    Next injection due 12/10/22-12/24/22.  Reminder card given.    Talbot Grumbling, RN

## 2022-10-30 ENCOUNTER — Encounter (HOSPITAL_COMMUNITY): Payer: Self-pay

## 2022-10-30 ENCOUNTER — Ambulatory Visit (HOSPITAL_COMMUNITY)
Admission: EM | Admit: 2022-10-30 | Discharge: 2022-10-30 | Disposition: A | Discharge status: Home / Self Care | Payer: Medicaid Other | Attending: Internal Medicine | Admitting: Internal Medicine

## 2022-10-30 DIAGNOSIS — B349 Viral infection, unspecified: Secondary | ICD-10-CM | POA: Diagnosis present

## 2022-10-30 DIAGNOSIS — Z1152 Encounter for screening for COVID-19: Secondary | ICD-10-CM | POA: Diagnosis not present

## 2022-10-30 DIAGNOSIS — J029 Acute pharyngitis, unspecified: Secondary | ICD-10-CM | POA: Diagnosis not present

## 2022-10-30 LAB — RESP PANEL BY RT-PCR (FLU A&B, COVID) ARPGX2
Influenza A by PCR: NEGATIVE
Influenza B by PCR: NEGATIVE
SARS Coronavirus 2 by RT PCR: NEGATIVE

## 2022-10-30 LAB — POCT RAPID STREP A, ED / UC: Streptococcus, Group A Screen (Direct): NEGATIVE

## 2022-10-30 MED ORDER — PSEUDOEPHEDRINE HCL ER 120 MG PO TB12: 120.0000 mg | ORAL_TABLET | Freq: Two times a day (BID) | ORAL | 0 refills | Status: DC

## 2022-10-30 MED ORDER — FLUTICASONE PROPIONATE 50 MCG/ACT NA SUSP: 2.0000 | Freq: Every day | NASAL | 2 refills | Status: DC

## 2022-10-30 MED ORDER — IBUPROFEN 800 MG PO TABS: 800.0000 mg | ORAL_TABLET | Freq: Three times a day (TID) | ORAL | 0 refills | Status: DC | PRN

## 2022-10-30 NOTE — Discharge Instructions (Signed)
Ibuprofen 800 mg also sent to the pharmacy, you can take this every 8 hours as needed.  Please make sure to put food on your stomach when taking the ibuprofen.  Flonase has been sent to the pharmacy, you can place 2 sprays in each nostril 2 times daily.  Sudafed has been sent to the pharmacy, this can be used for nasal congestion, you can use this 2 times daily with 12 hours in between each dose.   We will call you if any of your test results are positive or warrant a change in your plan of care.   Viral illnesses usually takes 7 to 10 days to resolve.  Using over-the-counter medications can help treat the symptoms.    Maintaining hydration status is very important, please drink at least 8 cups of water daily.  Please try to intake nutrient dense meals.   As discussed, if you begin developing any severe or new/concerning symptoms please go to the nearest emergency department for further evaluation.

## 2022-10-30 NOTE — ED Triage Notes (Signed)
Pt c/o cough, sore throat, headache, ear pain, fever, body ache, and diarrhea x3 days. States taking OTC meds with no relief.

## 2022-10-30 NOTE — ED Provider Notes (Addendum)
Sherry Santos    CSN: [id number] Arrival date & time: 10/30/22  0809      History   Chief Complaint Chief Complaint  Patient presents with   Cough    HPI Sherry Santos is a 21 y.o. female.  Patient presents complaining of sore throat, generalized body aches, fever, and headache that has been ongoing for the past 3 days.  Patient reports having a temperature of 101 Fahrenheit at home at its highest.  Patient reports having an episode of diarrhea.  Patient and patient's mother state a illness of unknown etiology has been going around the house, patient states that her brother was sick but she feels a lot worse than he is.  Patient reports fatigue and lightheadedness at times.  Patient has taken over-the-counter Robitussin with minimal relief of symptoms.  Patient reports a history of thyroid disorder and she currently takes levothyroxine.  Patient's mother is at bedside.   Cough Associated symptoms: chills, ear pain (Bilateral ear pressure), fever, myalgias, rhinorrhea and sore throat   Associated symptoms: no chest pain, no diaphoresis, no shortness of breath and no wheezing     Past Medical History:  Diagnosis Date   Febrile seizures ([place])    Shortness of breath 09/11/2021   Tension headache 01/25/2021   Thyroid disease    Thyroid disorder     Patient Active Problem List   Diagnosis Date Noted   Anxiety 04/16/2022   Maculopapular rash 04/16/2022   Back pain 12/27/2021   Birth control counseling 12/11/2020   Screening examination for STD (sexually transmitted disease) 12/11/2020   Hypothyroidism 12/09/2020   GERD (gastroesophageal reflux disease) 12/09/2020    Past Surgical History:  Procedure Laterality Date   WISDOM TOOTH EXTRACTION      OB History   No obstetric history on file.      Home Medications    Prior to Admission medications   Medication Sig Start Date End Date Taking? Authorizing Provider  fluticasone (FLONASE) 50 MCG/ACT nasal spray  Place 2 sprays into both nostrils daily. 10/30/22  Yes Flossie Dibble, NP  ibuprofen (ADVIL) 800 MG tablet Take 1 tablet (800 mg total) by mouth every 8 (eight) hours as needed. 10/30/22  Yes Flossie Dibble, NP  pseudoephedrine (SUDAFED 12 HOUR) 120 MG 12 hr tablet Take 1 tablet (120 mg total) by mouth 2 (two) times daily. 10/30/22  Yes Fallen Crisostomo N, NP  busPIRone (BUSPAR) 7.5 MG tablet TAKE 1 TABLET BY MOUTH 3 TIMES DAILY. 05/03/22   Lattie Haw, MD  escitalopram (LEXAPRO) 10 MG tablet TAKE 1 TABLET BY MOUTH EVERY DAY 05/03/22   Lattie Haw, MD  hydrOXYzine (VISTARIL) 25 MG capsule TAKE 1 CAPSULE (25 MG TOTAL) BY MOUTH AT BEDTIME AS NEEDED. 12/25/21   Lattie Haw, MD  levothyroxine (SYNTHROID) 88 MCG tablet Take by mouth. 07/20/20   [provider]  ipratropium (ATROVENT) 0.06 % nasal spray Place 2 sprays into both nostrils 4 (four) times daily. 08/22/16 09/07/19  Billy Fischer, MD    Family History Family History  Problem Relation Age of Onset   Seizures Mother    Thyroid disease Mother    Heart disease Mother    Gallbladder disease Mother    Diabetes Father    Heart disease Sister    Pancreatic cancer Other    Stomach cancer Neg Hx    Colon cancer Neg Hx    Esophageal cancer Neg Hx     Social History Social History  Tobacco Use   Smoking status: Never    Passive exposure: Yes   Smokeless tobacco: Never  Vaping Use   Vaping Use: Never used  Substance Use Topics   Alcohol use: No   Drug use: No     Allergies   Patient has no known allergies.   Review of Systems Review of Systems  Constitutional:  Positive for activity change, appetite change, chills, fatigue and fever. Negative for diaphoresis.  HENT:  Positive for congestion, ear pain (Bilateral ear pressure), postnasal drip, rhinorrhea and sore throat. Negative for ear discharge, sinus pressure, sinus pain, sneezing, trouble swallowing and voice change.   Eyes: Negative.   Respiratory:   Positive for cough. Negative for chest tightness, shortness of breath, wheezing and stridor.   Cardiovascular:  Negative for chest pain and palpitations.  Gastrointestinal:  Negative for abdominal pain, constipation, nausea and vomiting.  Musculoskeletal:  Positive for myalgias.     Physical Exam Triage Vital Signs ED Triage Vitals  Enc Vitals Group     BP 10/30/22 0909 136/81     Pulse Rate 10/30/22 0909 (!) 102     Resp 10/30/22 0909 18     Temp 10/30/22 0909 98.9 F (37.2 C)     Temp Source 10/30/22 0909 Oral     SpO2 10/30/22 0909 98 %     Weight --      Height --      Head Circumference --      Peak Flow --      Pain Score 10/30/22 0910 10     Pain Loc --      Pain Edu? --      Excl. in GC? --    No data found.  Updated Vital Signs BP 136/81 (BP Location: Left Arm)   Pulse (!) 102   Temp 98.9 F (37.2 C) (Oral)   Resp 18   SpO2 98%      Physical Exam Vitals and nursing note reviewed.  Constitutional:      Appearance: Normal appearance.  HENT:     Right Ear: Hearing, ear canal and external ear normal. Tympanic membrane is bulging.     Left Ear: Hearing, ear canal and external ear normal. Tympanic membrane is bulging.     Nose: Rhinorrhea present. No congestion. Rhinorrhea is clear.     Right Turbinates: Pale. Not enlarged or swollen.     Left Turbinates: Pale. Not enlarged or swollen.     Right Sinus: No maxillary sinus tenderness or frontal sinus tenderness.     Left Sinus: No maxillary sinus tenderness or frontal sinus tenderness.     Mouth/Throat:     Mouth: Mucous membranes are moist. No oral lesions.     Dentition: Normal dentition.     Pharynx: Uvula midline. Posterior oropharyngeal erythema present. No pharyngeal swelling, oropharyngeal exudate or uvula swelling.     Tonsils: No tonsillar exudate or tonsillar abscesses. 0 on the right. 0 on the left.  Cardiovascular:     Rate and Rhythm: Normal rate and regular rhythm.     Heart sounds: Normal heart  sounds, S1 normal and S2 normal.  Pulmonary:     Effort: Pulmonary effort is normal.     Breath sounds: Normal breath sounds and air entry. No decreased breath sounds, wheezing, rhonchi or rales.  Lymphadenopathy:     Head:     Right side of head: Tonsillar adenopathy present.     Left side of head: Tonsillar adenopathy present.  Cervical: Cervical adenopathy present.     Right cervical: Deep cervical adenopathy present. No superficial or posterior cervical adenopathy.    Left cervical: Deep cervical adenopathy present. No superficial or posterior cervical adenopathy.  Neurological:     General: No focal deficit present.     Mental Status: She is alert and oriented to person, place, and time.     GCS: GCS eye subscore is 4. GCS verbal subscore is 5. GCS motor subscore is 6.      UC Treatments / Results  Labs (all labs ordered are listed, but only abnormal results are displayed) Labs Reviewed  RESP PANEL BY RT-PCR (FLU A&B, COVID) ARPGX2  CULTURE, GROUP A STREP [facility])  POCT RAPID STREP A, ED / UC    EKG   Radiology No results found.  Procedures Procedures (including critical care time)  Medications Ordered in UC Medications - No data to display  Initial Impression / Assessment and Plan / UC Course  I have reviewed the triage vital signs and the nursing notes.  Pertinent labs & imaging results that were available during my care of the patient were reviewed by me and considered in my medical decision making (see chart for details).     Patient was evaluated for viral illness. COVID and Flu test is pending.  Strep culture pending to rule out bacterial etiology, low suspicion of strep based on physical presentation. Candidate for Tamiflu if flu test is positive, patient was made aware of this. Declined Toradol injection. Ibuprofen, Flonase, and Sudafed sent to pharmacy for symptom management.  Patient was made aware of medication regiment.  Patient was made aware of  symptom management of a viral illness.  Patient was given a work note. Patient was made aware of red flag symptoms that warrant an emergency department visit. Patient made aware of timeline for symptom resolution and when follow-up would be necessary.  Patient made aware of results reporting protocol and MyChart.  Patient verbalized understanding of instructions.    Charting was provided using a a verbal dictation system, charting was proofread for errors, errors may occur which could change the meaning of the information charted.   Final Clinical Impressions(s) / UC Diagnoses   Final diagnoses:  Viral illness     Discharge Instructions      Ibuprofen 800 mg also sent to the pharmacy, you can take this every 8 hours as needed.  Please make sure to put food on your stomach when taking the ibuprofen.  Flonase has been sent to the pharmacy, you can place 2 sprays in each nostril 2 times daily.  Sudafed has been sent to the pharmacy, this can be used for nasal congestion, you can use this 2 times daily with 12 hours in between each dose.   We will call you if any of your test results are positive or warrant a change in your plan of care.   Viral illnesses usually takes 7 to 10 days to resolve.  Using over-the-counter medications can help treat the symptoms.    Maintaining hydration status is very important, please drink at least 8 cups of water daily.  Please try to intake nutrient dense meals.   As discussed, if you begin developing any severe or new/concerning symptoms please go to the nearest emergency department for further evaluation.      ED Prescriptions     Medication Sig Dispense Auth. Provider   fluticasone (FLONASE) 50 MCG/ACT nasal spray Place 2 sprays into both nostrils daily. 9.9  mL Flossie Dibble, NP   ibuprofen (ADVIL) 800 MG tablet Take 1 tablet (800 mg total) by mouth every 8 (eight) hours as needed. 40 tablet Flossie Dibble, NP   pseudoephedrine (SUDAFED 12  HOUR) 120 MG 12 hr tablet Take 1 tablet (120 mg total) by mouth 2 (two) times daily. 16 tablet Flossie Dibble, NP      PDMP not reviewed this encounter.   Flossie Dibble, NP 10/30/22 1039    Flossie Dibble, NP 10/30/22 1041

## 2022-11-02 ENCOUNTER — Encounter: Payer: Self-pay | Admitting: Student

## 2022-11-04 ENCOUNTER — Encounter (HOSPITAL_COMMUNITY): Payer: Self-pay | Admitting: *Deleted

## 2022-11-04 ENCOUNTER — Ambulatory Visit (HOSPITAL_COMMUNITY)
Admission: EM | Admit: 2022-11-04 | Discharge: 2022-11-04 | Disposition: A | Discharge status: Home / Self Care | Payer: Medicaid Other | Attending: Family Medicine | Admitting: Family Medicine

## 2022-11-04 DIAGNOSIS — H6692 Otitis media, unspecified, left ear: Secondary | ICD-10-CM | POA: Diagnosis not present

## 2022-11-04 DIAGNOSIS — H6690 Otitis media, unspecified, unspecified ear: Secondary | ICD-10-CM

## 2022-11-04 MED ORDER — KETOROLAC TROMETHAMINE 30 MG/ML IJ SOLN
30.0000 mg | Freq: Once | INTRAMUSCULAR | Status: AC
  Administered 2022-11-04: 30 mg via INTRAMUSCULAR

## 2022-11-04 MED ORDER — KETOROLAC TROMETHAMINE 30 MG/ML IJ SOLN
INTRAMUSCULAR | Status: AC
  Filled 2022-11-04: qty 1

## 2022-11-04 MED ORDER — NAPROXEN 500 MG PO TABS: 500.0000 mg | ORAL_TABLET | Freq: Two times a day (BID) | ORAL | 0 refills | Status: DC | PRN

## 2022-11-04 MED ORDER — CEFDINIR 300 MG PO CAPS
600.0000 mg | ORAL_CAPSULE | Freq: Every day | ORAL | 0 refills | Status: AC
Start: 2022-11-04 — End: 2022-11-11

## 2022-11-04 NOTE — ED Provider Notes (Signed)
[place]    CSN: [id number] Arrival date & time: 11/04/22  1001      History   Chief Complaint Chief Complaint  Patient presents with   Nasal Congestion   Sore Throat   Otalgia    HPI Sherry Santos is a 21 y.o. female.    Sore Throat  Otalgia  Here for continued sore throat and now ear pain.  Approximately 12/9 she began having cough/congestion/sore throat and fever. Seen here 12/12, and testing for strep, flu and covid all negative.   A lot of her symptoms improved, but her sore throat continued unabated, and now both ears hurt. The backup throat culture that was ordered was not completed by the lab.  No nausea or vomiting or diarrhea.  She is still coughing a little bit, but has no shortness of breath.  Past Medical History:  Diagnosis Date   Febrile seizures ([place])    Shortness of breath 09/11/2021   Tension headache 01/25/2021   Thyroid disease    Thyroid disorder     Patient Active Problem List   Diagnosis Date Noted   Anxiety 04/16/2022   Maculopapular rash 04/16/2022   Back pain 12/27/2021   Birth control counseling 12/11/2020   Screening examination for STD (sexually transmitted disease) 12/11/2020   Hypothyroidism 12/09/2020   GERD (gastroesophageal reflux disease) 12/09/2020    Past Surgical History:  Procedure Laterality Date   WISDOM TOOTH EXTRACTION      OB History   No obstetric history on file.      Home Medications    Prior to Admission medications   Medication Sig Start Date End Date Taking? Authorizing Provider  busPIRone (BUSPAR) 7.5 MG tablet TAKE 1 TABLET BY MOUTH 3 TIMES DAILY. 05/03/22  Yes Lattie Haw, MD  cefdinir (OMNICEF) 300 MG capsule Take 2 capsules (600 mg total) by mouth daily for 7 days. 11/04/22 11/11/22 Yes Barrett Henle, MD  escitalopram (LEXAPRO) 10 MG tablet TAKE 1 TABLET BY MOUTH EVERY DAY 05/03/22  Yes Lattie Haw, MD  fluticasone (FLONASE) 50 MCG/ACT nasal spray Place 2 sprays into  both nostrils daily. 10/30/22  Yes Flossie Dibble, NP  hydrOXYzine (VISTARIL) 25 MG capsule TAKE 1 CAPSULE (25 MG TOTAL) BY MOUTH AT BEDTIME AS NEEDED. 12/25/21  Yes Lattie Haw, MD  levothyroxine (SYNTHROID) 88 MCG tablet Take by mouth. 07/20/20  Yes [provider]  naproxen (NAPROSYN) 500 MG tablet Take 1 tablet (500 mg total) by mouth 2 (two) times daily as needed (pain). 11/04/22  Yes Barrett Henle, MD  ipratropium (ATROVENT) 0.06 % nasal spray Place 2 sprays into both nostrils 4 (four) times daily. 08/22/16 09/07/19  Billy Fischer, MD    Family History Family History  Problem Relation Age of Onset   Seizures Mother    Thyroid disease Mother    Heart disease Mother    Gallbladder disease Mother    Diabetes Father    Heart disease Sister    Pancreatic cancer Other    Stomach cancer Neg Hx    Colon cancer Neg Hx    Esophageal cancer Neg Hx     Social History Social History   Tobacco Use   Smoking status: Never    Passive exposure: Yes   Smokeless tobacco: Never  Vaping Use   Vaping Use: Never used  Substance Use Topics   Alcohol use: No   Drug use: No     Allergies   Patient has no known allergies.  Review of Systems Review of Systems  HENT:  Positive for ear pain.      Physical Exam Triage Vital Signs ED Triage Vitals  Enc Vitals Group     BP 11/04/22 1013 114/79     Pulse Rate 11/04/22 1013 (!) 115     Resp 11/04/22 1013 18     Temp 11/04/22 1013 99 F (37.2 C)     Temp Source 11/04/22 1013 Oral     SpO2 11/04/22 1013 98 %     Weight --      Height --      Head Circumference --      Peak Flow --      Pain Score 11/04/22 1011 10     Pain Loc --      Pain Edu? --      Excl. in GC? --    No data found.  Updated Vital Signs BP 114/79 (BP Location: Left Arm)   Pulse (!) 115   Temp 99 F (37.2 C) (Oral)   Resp 18   SpO2 98%   Visual Acuity Right Eye Distance:   Left Eye Distance:   Bilateral Distance:    Right Eye  Near:   Left Eye Near:    Bilateral Near:     Physical Exam Vitals reviewed.  Constitutional:      General: She is not in acute distress.    Appearance: She is not ill-appearing, toxic-appearing or diaphoretic.  HENT:     Right Ear: Tympanic membrane and ear canal normal.     Left Ear: Ear canal normal.     Ears:     Comments: Left tympanic membrane is erythematous and landmarks are altered.  There is a light reflex but it is also not normal.    Nose: Congestion present.     Mouth/Throat:     Mouth: Mucous membranes are moist.     Comments: There is erythema of both tonsils with some white mucus on the left tonsillar crypts.  Tonsils are hypertrophied 1+ Eyes:     Extraocular Movements: Extraocular movements intact.     Conjunctiva/sclera: Conjunctivae normal.     Pupils: Pupils are equal, round, and reactive to light.  Cardiovascular:     Rate and Rhythm: Normal rate and regular rhythm.     Heart sounds: No murmur heard. Pulmonary:     Effort: Pulmonary effort is normal. No respiratory distress.     Breath sounds: No stridor. No wheezing, rhonchi or rales.  Musculoskeletal:     Cervical back: Neck supple.  Lymphadenopathy:     Cervical: No cervical adenopathy.  Skin:    Coloration: Skin is not jaundiced or pale.  Neurological:     General: No focal deficit present.     Mental Status: She is alert and oriented to person, place, and time.  Psychiatric:        Behavior: Behavior normal.      UC Treatments / Results  Labs (all labs ordered are listed, but only abnormal results are displayed) Labs Reviewed - No data to display  EKG   Radiology No results found.  Procedures Procedures (including critical care time)  Medications Ordered in UC Medications  ketorolac (TORADOL) 30 MG/ML injection 30 mg (has no administration in time range)    Initial Impression / Assessment and Plan / UC Course  I have reviewed the triage vital signs and the nursing  notes.  Pertinent labs & imaging results that were available during my  care of the patient were reviewed by me and considered in my medical decision making (see chart for details).     Going to treat for acute otitis media.  She states the ibuprofen is not helping her pain. Final Clinical Impressions(s) / UC Diagnoses   Final diagnoses:  Acute otitis media, unspecified otitis media type     Discharge Instructions      You have been given a shot of Toradol 30 mg today.  Take cefdinir 300 mg--2 capsules together daily for 7 days  Take naproxen 500 mg--1 tablet every 12 hours as needed for pain      ED Prescriptions     Medication Sig Dispense Auth. Provider   cefdinir (OMNICEF) 300 MG capsule Take 2 capsules (600 mg total) by mouth daily for 7 days. 14 capsule Barrett Henle, MD   naproxen (NAPROSYN) 500 MG tablet Take 1 tablet (500 mg total) by mouth 2 (two) times daily as needed (pain). 30 tablet Doyt Castellana, Gwenlyn Perking, MD      PDMP not reviewed this encounter.   Barrett Henle, MD 11/04/22 1030

## 2022-11-04 NOTE — ED Triage Notes (Signed)
Pt states since last OV she still has a sore throat, runny nose and now she has ear pain. She is taking the IBU 800mg  given at OV and she is taking mucinex D without relief.

## 2022-11-04 NOTE — Discharge Instructions (Signed)
You have been given a shot of Toradol 30 mg today.  Take cefdinir 300 mg--2 capsules together daily for 7 days  Take naproxen 500 mg--1 tablet every 12 hours as needed for pain

## 2022-12-10 ENCOUNTER — Ambulatory Visit (INDEPENDENT_AMBULATORY_CARE_PROVIDER_SITE_OTHER): Payer: Medicaid Other

## 2022-12-10 DIAGNOSIS — Z3042 Encounter for surveillance of injectable contraceptive: Secondary | ICD-10-CM

## 2022-12-10 MED ORDER — MEDROXYPROGESTERONE ACETATE 150 MG/ML IM SUSP
150.0000 mg | Freq: Once | INTRAMUSCULAR | Status: AC
  Administered 2022-12-10: 150 mg via INTRAMUSCULAR

## 2022-12-10 NOTE — Progress Notes (Signed)
Patient here today for Depo Provera injection and is within her dates.    Last contraceptive appt was 04/10/22  Depo given in [place] today.  Site unremarkable & patient tolerated injection.    Next injection due 02/26/23-03/12/23.  Reminder card given.    Talbot Grumbling, RN

## 2023-02-10 ENCOUNTER — Ambulatory Visit
Admission: EM | Admit: 2023-02-10 | Discharge: 2023-02-10 | Disposition: A | Discharge status: Home / Self Care | Payer: BLUE CROSS/BLUE SHIELD | Attending: Internal Medicine | Admitting: Internal Medicine

## 2023-02-10 DIAGNOSIS — T63301A Toxic effect of unspecified spider venom, accidental (unintentional), initial encounter: Secondary | ICD-10-CM

## 2023-02-10 DIAGNOSIS — L03113 Cellulitis of right upper limb: Secondary | ICD-10-CM

## 2023-02-10 MED ORDER — DOXYCYCLINE HYCLATE 100 MG PO CAPS: 100.0000 mg | ORAL_CAPSULE | Freq: Two times a day (BID) | ORAL | 0 refills | Status: DC

## 2023-02-10 NOTE — ED Provider Notes (Signed)
[facility]    CSN: [id number] Arrival date & time: 02/10/23  1228      History   Chief Complaint Chief Complaint  Patient presents with   skin lesion    HPI Sherry Santos is a 22 y.o. female.   Patient presents with possible spider bite to right forearm that she noticed yesterday.  Denies noticing any spider or insect bite her or feel anything bite her.  Denies injury to the area.  Denies any purulent drainage.  Patient denies fever, body aches, chills, nausea, vomiting.     Past Medical History:  Diagnosis Date   Febrile seizures ([place])    Shortness of breath 09/11/2021   Tension headache 01/25/2021   Thyroid disease    Thyroid disorder     Patient Active Problem List   Diagnosis Date Noted   Anxiety 04/16/2022   Maculopapular rash 04/16/2022   Back pain 12/27/2021   Birth control counseling 12/11/2020   Screening examination for STD (sexually transmitted disease) 12/11/2020   Hypothyroidism 12/09/2020   GERD (gastroesophageal reflux disease) 12/09/2020    Past Surgical History:  Procedure Laterality Date   WISDOM TOOTH EXTRACTION      OB History   No obstetric history on file.      Home Medications    Prior to Admission medications   Medication Sig Start Date End Date Taking? Authorizing Provider  doxycycline (VIBRAMYCIN) 100 MG capsule Take 1 capsule (100 mg total) by mouth 2 (two) times daily. 02/10/23  Yes Yalissa Fink, Hildred Alamin E, FNP  busPIRone (BUSPAR) 7.5 MG tablet TAKE 1 TABLET BY MOUTH 3 TIMES DAILY. 05/03/22   Sheela Stack, MD  escitalopram (LEXAPRO) 10 MG tablet TAKE 1 TABLET BY MOUTH EVERY DAY 05/03/22   Sheela Stack, MD  fluticasone (FLONASE) 50 MCG/ACT nasal spray Place 2 sprays into both nostrils daily. 10/30/22   Flossie Dibble, NP  hydrOXYzine (VISTARIL) 25 MG capsule TAKE 1 CAPSULE (25 MG TOTAL) BY MOUTH AT BEDTIME AS NEEDED. 12/25/21   Sheela Stack, MD  levothyroxine (SYNTHROID) 88 MCG tablet Take by  mouth. 07/20/20   [provider]  naproxen (NAPROSYN) 500 MG tablet Take 1 tablet (500 mg total) by mouth 2 (two) times daily as needed (pain). 11/04/22   Barrett Henle, MD  ipratropium (ATROVENT) 0.06 % nasal spray Place 2 sprays into both nostrils 4 (four) times daily. 08/22/16 09/07/19  Billy Fischer, MD    Family History Family History  Problem Relation Age of Onset   Seizures Mother    Thyroid disease Mother    Heart disease Mother    Gallbladder disease Mother    Diabetes Father    Heart disease Sister    Pancreatic cancer Other    Stomach cancer Neg Hx    Colon cancer Neg Hx    Esophageal cancer Neg Hx     Social History Social History   Tobacco Use   Smoking status: Never    Passive exposure: Yes   Smokeless tobacco: Never  Vaping Use   Vaping Use: Never used  Substance Use Topics   Alcohol use: No   Drug use: No     Allergies   Patient has no known allergies.   Review of Systems Review of Systems Per HPI  Physical Exam Triage Vital Signs ED Triage Vitals [02/10/23 1447]  Enc Vitals Group     BP 107/68     Pulse Rate 74     Resp 16  Temp 98 F (36.7 C)     Temp Source Oral     SpO2 99 %     Weight      Height      Head Circumference      Peak Flow      Pain Score 5     Pain Loc      Pain Edu?      Excl. in [place]?    No data found.  Updated Vital Signs BP 107/68 (BP Location: Left Arm)   Pulse 74   Temp 98 F (36.7 C) (Oral)   Resp 16   SpO2 99%   Visual Acuity Right Eye Distance:   Left Eye Distance:   Bilateral Distance:    Right Eye Near:   Left Eye Near:    Bilateral Near:     Physical Exam Constitutional:      General: She is not in acute distress.    Appearance: Normal appearance. She is not toxic-appearing or diaphoretic.  HENT:     Head: Normocephalic and atraumatic.  Eyes:     Extraocular Movements: Extraocular movements intact.     Conjunctiva/sclera: Conjunctivae normal.  Pulmonary:     Effort:  Pulmonary effort is normal.  Skin:    Comments: Patient has approximately 2.5 to 3 cm in diameter indurated, flat area of erythema present to right proximal forearm and directly below anterior elbow.  Small pinpoint white pustular area to the center.  No streaking noted.  No black discoloration.  Neurovascular intact.  Neurological:     General: No focal deficit present.     Mental Status: She is alert and oriented to person, place, and time. Mental status is at baseline.  Psychiatric:        Mood and Affect: Mood normal.        Behavior: Behavior normal.        Thought Content: Thought content normal.        Judgment: Judgment normal.      UC Treatments / Results  Labs (all labs ordered are listed, but only abnormal results are displayed) Labs Reviewed - No data to display  EKG   Radiology No results found.  Procedures Procedures (including critical care time)  Medications Ordered in UC Medications - No data to display  Initial Impression / Assessment and Plan / UC Course  I have reviewed the triage vital signs and the nursing notes.  Pertinent labs & imaging results that were available during my care of the patient were reviewed by me and considered in my medical decision making (see chart for details).     Physical exam is consistent with possible spider or insect bite.  I am mildly concerned about cellulitis related to it so will treat with doxycycline.  Skin was circled with skin pen and patient was advised to monitor for any increased redness, swelling, pus, black discoloration and follow-up sooner if it occurs.  She is not having any systemic symptoms so do not think that any blood work is necessary.  Advised strict follow-up precautions.  Patient verbalized understanding and was agreeable with plan. Final Clinical Impressions(s) / UC Diagnoses   Final diagnoses:  Spider bite wound, accidental or unintentional, initial encounter  Cellulitis of right arm      Discharge Instructions      It appears that you may have a spider bite.  Monitor closely for any increased redness, swelling, pus and follow-up sooner if this occurs.  Also follow-up if you  have any black discoloration.  I have prescribed an antibiotic to help treat any associated infection.  Take this with food.    ED Prescriptions     Medication Sig Dispense Auth. Provider   doxycycline (VIBRAMYCIN) 100 MG capsule Take 1 capsule (100 mg total) by mouth 2 (two) times daily. 20 capsule Teodora Medici, [place]      PDMP not reviewed this encounter.   Teodora Medici, [place] 02/10/23 [phone]

## 2023-02-10 NOTE — Discharge Instructions (Signed)
It appears that you may have a spider bite.  Monitor closely for any increased redness, swelling, pus and follow-up sooner if this occurs.  Also follow-up if you have any black discoloration.  I have prescribed an antibiotic to help treat any associated infection.  Take this with food.

## 2023-02-10 NOTE — ED Triage Notes (Signed)
Pt c/o pustular bump to the right forearm noticed yesterday. Reports this morning she woke up it was more erythema and edematous. Concerned for spider bite.

## 2023-03-01 ENCOUNTER — Ambulatory Visit: Payer: BLUE CROSS/BLUE SHIELD

## 2023-03-01 ENCOUNTER — Ambulatory Visit (INDEPENDENT_AMBULATORY_CARE_PROVIDER_SITE_OTHER): Payer: BLUE CROSS/BLUE SHIELD

## 2023-03-01 DIAGNOSIS — Z30019 Encounter for initial prescription of contraceptives, unspecified: Secondary | ICD-10-CM | POA: Diagnosis not present

## 2023-03-01 MED ORDER — MEDROXYPROGESTERONE ACETATE 150 MG/ML IM SUSY
150.0000 mg | PREFILLED_SYRINGE | Freq: Once | INTRAMUSCULAR | Status: AC
  Administered 2023-03-01: 150 mg via INTRAMUSCULAR

## 2023-03-01 NOTE — Progress Notes (Signed)
Patient here today for Depo Provera injection and is within her dates.     Last contraceptive appt was 04/10/22.   Depo given in LUOQ today.  Site unremarkable & patient tolerated injection.     Next injection due 05/17/2023-05/31/2023.    Reminder card given.

## 2023-03-14 ENCOUNTER — Other Ambulatory Visit: Payer: Self-pay | Admitting: Student

## 2023-03-15 ENCOUNTER — Encounter: Payer: Self-pay | Admitting: Family Medicine

## 2023-03-15 ENCOUNTER — Ambulatory Visit (INDEPENDENT_AMBULATORY_CARE_PROVIDER_SITE_OTHER): Payer: BLUE CROSS/BLUE SHIELD | Admitting: Family Medicine

## 2023-03-15 ENCOUNTER — Ambulatory Visit: Payer: BLUE CROSS/BLUE SHIELD | Admitting: Family Medicine

## 2023-03-15 VITALS — BP 102/60 | HR 79 | Ht 60.0 in | Wt 132.0 lb

## 2023-03-15 DIAGNOSIS — R0602 Shortness of breath: Secondary | ICD-10-CM

## 2023-03-15 DIAGNOSIS — M62838 Other muscle spasm: Secondary | ICD-10-CM | POA: Diagnosis not present

## 2023-03-15 DIAGNOSIS — F419 Anxiety disorder, unspecified: Secondary | ICD-10-CM

## 2023-03-15 DIAGNOSIS — Z0000 Encounter for general adult medical examination without abnormal findings: Secondary | ICD-10-CM

## 2023-03-15 MED ORDER — CYCLOBENZAPRINE HCL 10 MG PO TABS: 10.0000 mg | ORAL_TABLET | Freq: Three times a day (TID) | ORAL | 0 refills | Status: DC | PRN

## 2023-03-15 MED ORDER — LEVOTHYROXINE SODIUM 88 MCG PO TABS: 88.0000 ug | ORAL_TABLET | Freq: Every day | ORAL | 0 refills | Status: AC

## 2023-03-15 MED ORDER — HYDROXYZINE PAMOATE 25 MG PO CAPS: 25.0000 mg | ORAL_CAPSULE | Freq: Every evening | ORAL | 3 refills | Status: DC | PRN

## 2023-03-15 NOTE — Progress Notes (Unsigned)
    SUBJECTIVE:   CHIEF COMPLAINT / HPI:   Back pain Sherry Santos is a 22 yo woman here with worsening back pain.   Approximate 1 year duration, becoming more frequent - now daily Initially located lower right back Quality is sharp Sometimes radiates superiorly Prevents her from sleeping well at night No trauma or injuries before back pain started Denies fever, chills, numbness or tingling of arms or legs, saddle anesthesia, urine incontinence, or gait abnormalities Works at [employer] - lifts heavy boxes sometimes, does not push or pull heavy things, mainly picks things up  PERTINENT  PMH / PSH:  Patient Active Problem List   Diagnosis Date Noted   Muscle spasm 03/18/2023   Healthcare maintenance 03/18/2023   Anxiety 04/16/2022   Birth control counseling 12/11/2020   Hypothyroidism 12/09/2020   GERD (gastroesophageal reflux disease) 12/09/2020    OBJECTIVE:   BP 102/60   Pulse 79   Ht 5' (1.524 m)   Wt 132 lb (59.9 kg)   SpO2 98%   BMI 25.78 kg/m    General: Awake, alert, NAD Respiratory: Speaking clearly in full sentences MSK: Bilateral iliac crests aligned when standing, no midline TTP, obvious muscle spasm of lower right paraspinal muscles with associated TTP, no TTP of left paraspinal Neuro: BLE strength 5/5 and equal bilaterally of hip, knee, and ankle  ASSESSMENT/PLAN:   Muscle spasm Muscle spasm of right lower paraspinals per physical exam.  No red flags on history and physical. - Flexeril - Aleve - Massage and heat - Return for reevaluation and possible imaging if no improvement  Healthcare maintenance Recommended to return for Pap smear at her convenience  Anxiety Patient request refill of hydroxyzine.  She tolerates this well without adverse side effects.  Provided today.     Fayette Pho, MD [facility]

## 2023-03-15 NOTE — Patient Instructions (Addendum)
It was wonderful to see you today. Thank you for allowing me to be a part of your care. Below is a short summary of what we discussed at your visit today:  Back pain Likely a muscle spasm. Your right paraspinal muscles are all tensed up!  START Aleve 500 mg twice daily for 5 days. After that, use sparingly as needed.   START Flexeril. Use as directed - sparingly and only as needed. USE WITH CAUTION until you knowhow this affects you.   Go get some sort of massage after about a week of the above treatment. This will help work the knots out of your muscles.   If one or two rounds of the above approach does not work, come back and we can re-evaluate.   Vaccines HPV vaccine: You are all up-to-date on this one too!  Tdap vaccine: You are up-to-date on this Tdap vaccine.  Usually it is once every 10 years for maintenance.     PAP smear You are due to start PAP smears to screen for cervical cancer.  Please schedule at the front desk on your way out.  This can be at your convenience.   Please bring all of your medications to every appointment!  If you have any questions or concerns, please do not hesitate to contact us via phone or MyChart message.   Fayette Pho, MD

## 2023-03-18 ENCOUNTER — Encounter: Payer: Self-pay | Admitting: Family Medicine

## 2023-03-18 DIAGNOSIS — M62838 Other muscle spasm: Secondary | ICD-10-CM | POA: Insufficient documentation

## 2023-03-18 DIAGNOSIS — Z0000 Encounter for general adult medical examination without abnormal findings: Secondary | ICD-10-CM

## 2023-03-18 HISTORY — DX: Encounter for general adult medical examination without abnormal findings: Z00.00

## 2023-03-18 NOTE — Assessment & Plan Note (Signed)
Recommended to return for Pap smear at her convenience

## 2023-03-18 NOTE — Assessment & Plan Note (Signed)
Muscle spasm of right lower paraspinals per physical exam.  No red flags on history and physical. - Flexeril - Aleve - Massage and heat - Return for reevaluation and possible imaging if no improvement

## 2023-03-18 NOTE — Assessment & Plan Note (Signed)
Patient request refill of hydroxyzine.  She tolerates this well without adverse side effects.  Provided today.

## 2023-05-13 NOTE — Progress Notes (Deleted)
  SUBJECTIVE:   CHIEF COMPLAINT / HPI:   Back pain -Seen 03/15/23 for back pain, dx muscle spasm -Tx w/ Flexeril, Aleve, Massage and heat   PERTINENT  PMH / PSH: ***  Past Medical History:  Diagnosis Date   Febrile seizures (HCC)    Healthcare maintenance 03/18/2023   Maculopapular rash 04/16/2022   Shortness of breath 09/11/2021   Tension headache 01/25/2021   Thyroid disease    Thyroid disorder     Patient Care Team: Darral Dash, DO as PCP - General (Family Medicine) OBJECTIVE:  There were no vitals taken for this visit. Physical Exam   ASSESSMENT/PLAN:  There are no diagnoses linked to this encounter. No follow-ups on file. Bess Kinds, MD 05/13/2023, 6:45 PM PGY-***, [facility] Family Medicine {    This will disappear when note is signed, click to select method of visit    :1}

## 2023-05-14 ENCOUNTER — Ambulatory Visit: Payer: BLUE CROSS/BLUE SHIELD | Admitting: Student

## 2023-05-20 ENCOUNTER — Ambulatory Visit (INDEPENDENT_AMBULATORY_CARE_PROVIDER_SITE_OTHER): Payer: Medicaid Other

## 2023-05-20 DIAGNOSIS — Z3009 Encounter for other general counseling and advice on contraception: Secondary | ICD-10-CM | POA: Diagnosis not present

## 2023-05-20 MED ORDER — MEDROXYPROGESTERONE ACETATE 150 MG/ML IM SUSY
150.0000 mg | PREFILLED_SYRINGE | Freq: Once | INTRAMUSCULAR | Status: AC
  Administered 2023-05-20: 150 mg via INTRAMUSCULAR

## 2023-05-20 NOTE — Progress Notes (Signed)
Patient here today for Depo Provera injection and is within her dates.    Last contraceptive appt was 04/10/2022.  Depo given in RUOQ today.  Site unremarkable & patient tolerated injection.    Next injection due 08/05/2023-08/19/2023.  Reminder card given.    Patient advised to schedule next depo with her PCP for contraception management. Last contraceptive apt was over 1 year ago. Will not be able to give her another depo in nurse clinic without a provider apt.   Patient voiced understanding.

## 2023-05-22 ENCOUNTER — Telehealth: Payer: Self-pay

## 2023-05-22 NOTE — Telephone Encounter (Signed)
LVM for patient to call back [phone], or to call PCP office to schedule follow up apt. AS, CMA  

## 2023-07-29 ENCOUNTER — Encounter: Payer: Self-pay | Admitting: Student

## 2023-08-05 ENCOUNTER — Encounter: Payer: Self-pay | Admitting: Student

## 2023-08-05 ENCOUNTER — Ambulatory Visit (INDEPENDENT_AMBULATORY_CARE_PROVIDER_SITE_OTHER): Payer: Medicaid Other | Admitting: Student

## 2023-08-05 VITALS — BP 119/71 | HR 71 | Ht 60.0 in | Wt 140.8 lb

## 2023-08-05 DIAGNOSIS — Z3042 Encounter for surveillance of injectable contraceptive: Secondary | ICD-10-CM | POA: Diagnosis present

## 2023-08-05 MED ORDER — MEDROXYPROGESTERONE ACETATE 150 MG/ML IM SUSY
150.0000 mg | PREFILLED_SYRINGE | Freq: Once | INTRAMUSCULAR | Status: AC
  Administered 2023-08-05: 150 mg via INTRAMUSCULAR

## 2023-08-05 NOTE — Assessment & Plan Note (Addendum)
Date last pap: Never done. Last Depo-Provera: 05/20/2023 Side Effects if any: None. Serum HCG indicated?  No, Depo-Provera 150 mg IM given by: RN Page Lorin Picket. Next appointment due: 10/21/2023-11/04/2023.  Reminder card given.  Marland Kitchen

## 2023-08-05 NOTE — Progress Notes (Signed)
Patient here today for Depo Provera injection and is within her dates.    Last contraceptive appt was 08/05/2023.  Depo given in LUOQ today.  Site unremarkable & patient tolerated injection.    Next injection due 10/21/2023-11/04/2023.  Reminder card given.

## 2023-08-05 NOTE — Patient Instructions (Addendum)
It was wonderful to see you today. Thank you for allowing me to be a part of your care. Below is a short summary of what we discussed at your visit today:  Your Depo-Provera shots was given today.  Your next time you are due for your Depo shot would be Dec 2-16. 2024  If you have any questions or concerns, please do not hesitate to contact us via phone or MyChart message.   Jerre Simon, MD [facility]

## 2023-08-05 NOTE — Progress Notes (Signed)
    SUBJECTIVE:   CHIEF COMPLAINT / HPI:   Patient is a 22 year old female presenting today for contraceptive. She has been on the Depo-Provera shots for over a year. Endorses good compliance and good tolerance No vaginal bleeding. LMP is over a year per patient.  PERTINENT  PMH / PSH: Reviewed  OBJECTIVE:   BP 119/71   Pulse 71   Ht 5' (1.524 m)   Wt 140 lb 12.8 oz (63.9 kg)   SpO2 100%   BMI 27.50 kg/m    Physical Exam General: Pleasant, well appearing, NAD Cardiovascular: Regular rate a.m. and rate, well-perfused Respiratory: Work of breathing on RA Psych: Appropriate judgment, normal affect.  ASSESSMENT/PLAN:   Encounter for surveillance of injectable contraceptive Date last pap: Never done. Last Depo-Provera: 05/20/2023 Side Effects if any: None. Serum HCG indicated?  No, Depo-Provera 150 mg IM given by: RN Page Lorin Picket. Next appointment due: 10/21/2023-11/04/2023.  Reminder card given.  Marland Kitchen   Healthcare maintenance -Patient due for flu and COVID-vaccine.  Declined both. -Patient is due for Pap smear deferred dose today and expressed preference for female provider.    Jerre Simon, MD [facility]

## 2023-08-09 ENCOUNTER — Ambulatory Visit: Payer: Medicaid Other | Admitting: Family Medicine

## 2023-08-28 DIAGNOSIS — E063 Autoimmune thyroiditis: Secondary | ICD-10-CM | POA: Diagnosis not present

## 2023-10-01 ENCOUNTER — Ambulatory Visit (INDEPENDENT_AMBULATORY_CARE_PROVIDER_SITE_OTHER): Payer: 59 | Admitting: Student

## 2023-10-01 ENCOUNTER — Other Ambulatory Visit (HOSPITAL_COMMUNITY)
Admission: RE | Admit: 2023-10-01 | Discharge: 2023-10-01 | Disposition: A | Discharge status: Home / Self Care | Payer: 59 | Source: Ambulatory Visit | Attending: Family Medicine | Admitting: Family Medicine

## 2023-10-01 ENCOUNTER — Encounter: Payer: Self-pay | Admitting: Student

## 2023-10-01 VITALS — BP 102/70 | HR 73 | Ht 60.0 in | Wt 139.0 lb

## 2023-10-01 DIAGNOSIS — Z01419 Encounter for gynecological examination (general) (routine) without abnormal findings: Secondary | ICD-10-CM | POA: Diagnosis not present

## 2023-10-01 DIAGNOSIS — Z124 Encounter for screening for malignant neoplasm of cervix: Secondary | ICD-10-CM | POA: Insufficient documentation

## 2023-10-01 NOTE — Progress Notes (Cosign Needed Addendum)
    SUBJECTIVE:   CHIEF COMPLAINT / HPI:   Rick Moshe is a 22 year-old female here for Pap smear. She has no concerns or complaints today- denies vaginal itching/irritation/odor. Does not desire STI screening today.  She is using Depo-Provera for contraception.  PERTINENT  PMH / PSH: Hypothyroidism  OBJECTIVE:   BP 102/70   Pulse 73   Ht 5' (1.524 m)   Wt 139 lb (63 kg)   SpO2 100%   BMI 27.15 kg/m   General: Well-appearing, pleasant, no distress Respiratory: Normal work of breathing on room air GU: Gilberto Better, CMA present as chaperone during exam. External vulva and vagina nonerythematous, without any obvious lesions or rash.  White discharge appreciated.  Normal ruggae of vaginal walls.  Cervix is non erythematous and non-friable.  There is no cervical motion tenderness, masses or gross abnormalities appreciated during bimanual exam.   ASSESSMENT/PLAN:   Encounter for screening for cervical cancer Pap cytology collected.  No abnormalities on exam today. Declined STI screening Educated patient regarding Pap smear and that is a screening for cervical cancer. Next follow-up in 1 month for Depo-Provera injection     Darral Dash, DO [facility]

## 2023-10-01 NOTE — Assessment & Plan Note (Addendum)
Pap cytology collected.  No abnormalities on exam today. Declined STI screening Educated patient regarding Pap smear and that is a screening for cervical cancer. Next follow-up in 1 month for Depo-Provera injection

## 2023-10-01 NOTE — Patient Instructions (Signed)
It was great seeing you today.  We collected routine cervical cancer screening today. If anything is abnormal, I will call you.  Otherwise, you will have this testing done in another 3 years.  I encourage you to always use condoms or other barrier methods if you are sexually active to prevent unwanted pregnancy or sexually transmitted infections.  If you have any questions or concerns, please feel free to call the clinic.   Have a wonderful day,  Dr. Darral Dash [facility] [phone]

## 2023-10-03 LAB — CYTOLOGY - PAP
Diagnosis: NEGATIVE
Diagnosis: REACTIVE

## 2023-10-31 ENCOUNTER — Ambulatory Visit (INDEPENDENT_AMBULATORY_CARE_PROVIDER_SITE_OTHER): Payer: 59

## 2023-10-31 DIAGNOSIS — Z3042 Encounter for surveillance of injectable contraceptive: Secondary | ICD-10-CM | POA: Diagnosis not present

## 2023-10-31 MED ORDER — MEDROXYPROGESTERONE ACETATE 150 MG/ML IM SUSP
150.0000 mg | Freq: Once | INTRAMUSCULAR | Status: AC
  Administered 2023-10-31: 150 mg via INTRAMUSCULAR

## 2023-10-31 NOTE — Progress Notes (Signed)
Patient here today for Depo Provera injection and is within her dates.    Last contraceptive appt was 10/01/23  Depo given in RUOQ today.  Site unremarkable & patient tolerated injection.    Next injection due 01/16/24-01/30/24.  Reminder card given.    Veronda Prude, RN

## 2024-01-20 ENCOUNTER — Ambulatory Visit (INDEPENDENT_AMBULATORY_CARE_PROVIDER_SITE_OTHER): Payer: 59

## 2024-01-20 DIAGNOSIS — Z3042 Encounter for surveillance of injectable contraceptive: Secondary | ICD-10-CM

## 2024-01-20 MED ORDER — MEDROXYPROGESTERONE ACETATE 150 MG/ML IM SUSP
150.0000 mg | Freq: Once | INTRAMUSCULAR | Status: AC
  Administered 2024-01-20: 150 mg via INTRAMUSCULAR

## 2024-01-20 NOTE — Progress Notes (Signed)
 Patient here today for Depo Provera injection and is within her dates.    Last contraceptive appt was 10/01/23  Depo given in LUOQ today.  Site unremarkable & patient tolerated injection.    Next injection due 04/06/24-04/20/24.  Reminder card given.    Veronda Prude, RN

## 2024-03-25 NOTE — Progress Notes (Signed)
   SUBJECTIVE:   CHIEF COMPLAINT / HPI: rashes/bumps on leg  Rash - Noticed the rash a week ago (only occurs on her legs) after showering at her mom's house (usually takes hot showers), showers there regularly since she lives there - No new exposures: soaps, detergents, bug bites - Has had this previously in May/June 2023 for which she received prednisone  for a week and that resolved the issue then - Job: [employer] (hot and dusty conditions), usually wears leggings and notices the rash being itchy there. - Denies any fevers, chills, body aches, itchiness (mainly occurs when she completes her shower or is at work), or bleeding  PERTINENT  PMH / PSH: Hypothyroid, Anxiety  OBJECTIVE:   BP (!) 113/59   Pulse 70   Ht 5' (1.524 m)   Wt 136 lb 3.2 oz (61.8 kg)   SpO2 100%   BMI 26.60 kg/m   General: Awake and Alert in NAD HEENT: NCAT. Sclera anicteric. No rhinorrhea. Cardiovascular: RRR. No M/R/G Respiratory: CTAB, normal WOB on RA. No wheezing, crackles, rhonchi, or diminished breath sounds. Abdomen: Soft, non-tender, non-distended. Bowel sounds normoactive Extremities: Able to move all extremities. No BLE edema, no deformities or significant joint findings. Skin: Warm and dry. No abrasions or rashes noted. Neuro: No focal neurological deficits.  ASSESSMENT/PLAN:   Assessment & Plan Heat rash Maculopapular rash noted a week ago, seemingly very seasonal.  Has improved over the course of the week.  She endorses previously having this last May/June, likely that this is heat related especially with hot showers and her work conditions while wearing tight fitted clothing. - Supportive care advised: Cool environments, loose-fitting clothing, Zyrtec vs topical hydrocortisone for itching, and cool showers - Advised patient to follow-up if symptoms worsen or continue reoccurring even after trying these measures - Previous workup was negative, TSH normal 7 months ago  [name], DO [facility]  Health [facility]

## 2024-03-26 ENCOUNTER — Ambulatory Visit (INDEPENDENT_AMBULATORY_CARE_PROVIDER_SITE_OTHER): Admitting: Family Medicine

## 2024-03-26 ENCOUNTER — Encounter: Payer: Self-pay | Admitting: Family Medicine

## 2024-03-26 VITALS — BP 113/59 | HR 70 | Ht 60.0 in | Wt 136.2 lb

## 2024-03-26 DIAGNOSIS — L740 Miliaria rubra: Secondary | ICD-10-CM

## 2024-03-26 NOTE — Patient Instructions (Addendum)
 It was great to see you today! Thank you for choosing [facility] for your primary care. Sherry Santos was seen for rash.  Today we addressed: Heat rash - avoid excessive sweating, wear loose-fitting clothing, cool environment preferred, cooler showers, Zyrtec or antihistamine vs topical hydrocortisone over-the-counter to help with itching  You should return to our clinic No follow-ups on file. Please arrive 15 minutes before your appointment to ensure smooth check in process.  We appreciate your efforts in making this happen.  Thank you for allowing me to participate in your care, [name], DO 03/26/2024, 4:00 PM PGY-1, [facility]

## 2024-04-27 ENCOUNTER — Ambulatory Visit

## 2024-04-27 DIAGNOSIS — Z3042 Encounter for surveillance of injectable contraceptive: Secondary | ICD-10-CM | POA: Diagnosis not present

## 2024-04-27 LAB — POCT URINE PREGNANCY: Preg Test, Ur: NEGATIVE

## 2024-04-27 MED ORDER — MEDROXYPROGESTERONE ACETATE 150 MG/ML IM SUSP
150.0000 mg | Freq: Once | INTRAMUSCULAR | Status: AC
  Administered 2024-04-27: 150 mg via INTRAMUSCULAR

## 2024-04-27 NOTE — Progress Notes (Addendum)
 Patient here today for Depo Provera  injection and is not within her dates.Patient reports that she has been sexually active within the last few days. Patient declines rx for Plan B. Urine pregnancy obtained and was negative. Offered to schedule lab visit in two weeks for repeat urine pregnancy. Patient declined and states that she will take home pregnancy test in two weeks.   Last contraceptive appt was 10/01/2023  Depo given in RUOQ today.  Site unremarkable & patient tolerated injection. Advised patient to use protection if sexually active within the next week to allow depo time to become effective.   Next injection due 07/13/24-07/27/24.  Reminder card given.    Elsie Halo, RN

## 2024-04-28 ENCOUNTER — Encounter: Payer: Self-pay | Admitting: *Deleted

## 2024-06-10 ENCOUNTER — Other Ambulatory Visit: Payer: Self-pay | Admitting: Family Medicine

## 2024-06-10 DIAGNOSIS — F419 Anxiety disorder, unspecified: Secondary | ICD-10-CM

## 2024-07-21 ENCOUNTER — Ambulatory Visit (INDEPENDENT_AMBULATORY_CARE_PROVIDER_SITE_OTHER)

## 2024-07-21 DIAGNOSIS — Z30019 Encounter for initial prescription of contraceptives, unspecified: Secondary | ICD-10-CM

## 2024-07-21 MED ORDER — MEDROXYPROGESTERONE ACETATE 150 MG/ML IM SUSP
150.0000 mg | Freq: Once | INTRAMUSCULAR | Status: AC
  Administered 2024-07-21: 150 mg via INTRAMUSCULAR

## 2024-07-21 NOTE — Progress Notes (Signed)
 Patient here today for Depo Provera  injection and is within her dates.    Last contraceptive appt was 10/01/2023.  Depo given in LUOQ today. Site unremarkable & patient tolerated injection.    Next injection due 10/06/2024-10/20/2024. Patient will need an apt with provider to renew depo prescription.   Reminder card given.

## 2024-08-27 DIAGNOSIS — E063 Autoimmune thyroiditis: Secondary | ICD-10-CM | POA: Diagnosis not present

## 2024-09-01 DIAGNOSIS — M9905 Segmental and somatic dysfunction of pelvic region: Secondary | ICD-10-CM | POA: Diagnosis not present

## 2024-09-01 DIAGNOSIS — M9902 Segmental and somatic dysfunction of thoracic region: Secondary | ICD-10-CM | POA: Diagnosis not present

## 2024-09-01 DIAGNOSIS — M9903 Segmental and somatic dysfunction of lumbar region: Secondary | ICD-10-CM | POA: Diagnosis not present

## 2024-09-02 DIAGNOSIS — M9903 Segmental and somatic dysfunction of lumbar region: Secondary | ICD-10-CM | POA: Diagnosis not present

## 2024-09-02 DIAGNOSIS — M9905 Segmental and somatic dysfunction of pelvic region: Secondary | ICD-10-CM | POA: Diagnosis not present

## 2024-09-02 DIAGNOSIS — M9902 Segmental and somatic dysfunction of thoracic region: Secondary | ICD-10-CM | POA: Diagnosis not present

## 2024-09-07 DIAGNOSIS — M9902 Segmental and somatic dysfunction of thoracic region: Secondary | ICD-10-CM | POA: Diagnosis not present

## 2024-09-07 DIAGNOSIS — M9903 Segmental and somatic dysfunction of lumbar region: Secondary | ICD-10-CM | POA: Diagnosis not present

## 2024-09-07 DIAGNOSIS — M9905 Segmental and somatic dysfunction of pelvic region: Secondary | ICD-10-CM | POA: Diagnosis not present

## 2024-09-09 DIAGNOSIS — M9902 Segmental and somatic dysfunction of thoracic region: Secondary | ICD-10-CM | POA: Diagnosis not present

## 2024-09-09 DIAGNOSIS — M9903 Segmental and somatic dysfunction of lumbar region: Secondary | ICD-10-CM | POA: Diagnosis not present

## 2024-09-09 DIAGNOSIS — M9905 Segmental and somatic dysfunction of pelvic region: Secondary | ICD-10-CM | POA: Diagnosis not present

## 2024-09-14 DIAGNOSIS — M9903 Segmental and somatic dysfunction of lumbar region: Secondary | ICD-10-CM | POA: Diagnosis not present

## 2024-09-14 DIAGNOSIS — M9905 Segmental and somatic dysfunction of pelvic region: Secondary | ICD-10-CM | POA: Diagnosis not present

## 2024-09-14 DIAGNOSIS — M9902 Segmental and somatic dysfunction of thoracic region: Secondary | ICD-10-CM | POA: Diagnosis not present

## 2024-09-16 DIAGNOSIS — M9905 Segmental and somatic dysfunction of pelvic region: Secondary | ICD-10-CM | POA: Diagnosis not present

## 2024-09-16 DIAGNOSIS — M9903 Segmental and somatic dysfunction of lumbar region: Secondary | ICD-10-CM | POA: Diagnosis not present

## 2024-09-16 DIAGNOSIS — M9902 Segmental and somatic dysfunction of thoracic region: Secondary | ICD-10-CM | POA: Diagnosis not present

## 2024-09-23 DIAGNOSIS — M9905 Segmental and somatic dysfunction of pelvic region: Secondary | ICD-10-CM | POA: Diagnosis not present

## 2024-09-23 DIAGNOSIS — M9903 Segmental and somatic dysfunction of lumbar region: Secondary | ICD-10-CM | POA: Diagnosis not present

## 2024-09-23 DIAGNOSIS — M9902 Segmental and somatic dysfunction of thoracic region: Secondary | ICD-10-CM | POA: Diagnosis not present

## 2024-09-30 DIAGNOSIS — M9905 Segmental and somatic dysfunction of pelvic region: Secondary | ICD-10-CM | POA: Diagnosis not present

## 2024-09-30 DIAGNOSIS — M9903 Segmental and somatic dysfunction of lumbar region: Secondary | ICD-10-CM | POA: Diagnosis not present

## 2024-09-30 DIAGNOSIS — M9902 Segmental and somatic dysfunction of thoracic region: Secondary | ICD-10-CM | POA: Diagnosis not present

## 2024-10-07 DIAGNOSIS — M9905 Segmental and somatic dysfunction of pelvic region: Secondary | ICD-10-CM | POA: Diagnosis not present

## 2024-10-07 DIAGNOSIS — M9902 Segmental and somatic dysfunction of thoracic region: Secondary | ICD-10-CM | POA: Diagnosis not present

## 2024-10-07 DIAGNOSIS — M9903 Segmental and somatic dysfunction of lumbar region: Secondary | ICD-10-CM | POA: Diagnosis not present

## 2024-10-20 ENCOUNTER — Ambulatory Visit (INDEPENDENT_AMBULATORY_CARE_PROVIDER_SITE_OTHER)

## 2024-10-20 ENCOUNTER — Ambulatory Visit: Admission: EM | Admit: 2024-10-20 | Discharge: 2024-10-20 | Disposition: A | Discharge status: Home / Self Care

## 2024-10-20 DIAGNOSIS — R079 Chest pain, unspecified: Secondary | ICD-10-CM

## 2024-10-20 DIAGNOSIS — Z3202 Encounter for pregnancy test, result negative: Secondary | ICD-10-CM

## 2024-10-20 LAB — POCT URINE PREGNANCY: Preg Test, Ur: NEGATIVE

## 2024-10-20 NOTE — Discharge Instructions (Signed)
 I suspect that your chest pain is musculoskeletal in nature.  Recommend Tylenol  and/or ibuprofen  as needed for pain.  Please use ibuprofen  sparingly given that you take Lexapro .  Go to the ER if chest pain persists or worsens.  Follow-up with your primary care doctor.  Blood work is pending.  We will call if it is abnormal.

## 2024-10-20 NOTE — ED Provider Notes (Signed)
 [facility]    CSN: [id number] Arrival date & time: 10/20/24  9188      History   Chief Complaint Chief Complaint  Patient presents with   Chest Pain    HPI Sherry Santos is a 23 y.o. female.   Patient presents with left-sided upper chest pain that started around 7 PM last night and has been constant.  She reports the pain is sharp in nature and ranges from 6/10 on pain scale to 10/10 on pain scale.  She currently rates it 5/10 on pain scale.  Denies any associated shortness of breath, dizziness, blurred vision, headache.  There are no aggravating or relieving factors.  She has not taken any medication for pain.  Denies any recent coughing, fever, respiratory symptoms.  Patient denies history of pulmonary or cardiac issues.  She denies smoking, vaping, illicit drug use.  Last menstrual cycle was approximately 2 years ago but this is baseline.  She is sexually active.   Chest Pain   Past Medical History:  Diagnosis Date   Febrile seizures (HCC)    GERD (gastroesophageal reflux disease) 12/09/2020   Healthcare maintenance 03/18/2023   Maculopapular rash 04/16/2022   Shortness of breath 09/11/2021   Tension headache 01/25/2021   Thyroid  disease    Thyroid  disorder     Patient Active Problem List   Diagnosis Date Noted   Anxiety 04/16/2022   Birth control counseling 12/11/2020   Hypothyroidism 12/09/2020   Hypothyroidism (acquired) 11/25/2012    Past Surgical History:  Procedure Laterality Date   WISDOM TOOTH EXTRACTION      OB History   No obstetric history on file.      Home Medications    Prior to Admission medications   Medication Sig Start Date End Date Taking? Authorizing Provider  escitalopram  (LEXAPRO ) 10 MG tablet Take 1 tablet by mouth daily. 05/03/22  Yes [provider]  levothyroxine  (SYNTHROID ) 100 MCG tablet Take 100 mcg by mouth daily.   Yes [provider]  levothyroxine  (SYNTHROID ) 88 MCG tablet Take 88 mcg  by mouth daily before breakfast. 08/24/22  Yes [provider]  omeprazole (PRILOSEC) 20 MG capsule Take 20 mg by mouth daily. 10/20/20  Yes [provider]  sucralfate (CARAFATE) 1 g tablet Take 1 g by mouth 4 (four) times daily -  with meals and at bedtime. 10/20/20  Yes [provider]  busPIRone  (BUSPAR ) 7.5 MG tablet TAKE 1 TABLET BY MOUTH 3 TIMES DAILY. 05/03/22   Patel, Poonamkumari J, MD  hydrOXYzine  (VISTARIL ) 25 MG capsule TAKE 1 CAPSULE (25 MG TOTAL) BY MOUTH AT BEDTIME AS NEEDED. 06/12/24   Lafe Domino, DO  levothyroxine  (SYNTHROID ) 88 MCG tablet Take 1 tablet (88 mcg total) by mouth daily before breakfast. 03/15/23   Dameron, Barabara, DO  medroxyPROGESTERone  (DEPO-PROVERA ) 150 MG/ML injection Inject 150 mg into the muscle every 3 (three) months.    [provider]    Family History Family History  Problem Relation Age of Onset   Seizures Mother    Thyroid  disease Mother    Heart disease Mother    Gallbladder disease Mother    Diabetes Father    Heart disease Sister    Pancreatic cancer Other    Stomach cancer Neg Hx    Colon cancer Neg Hx    Esophageal cancer Neg Hx     Social History Social History   Tobacco Use   Smoking status: Never    Passive exposure: Yes   Smokeless  tobacco: Never  Vaping Use   Vaping status: Never Used  Substance Use Topics   Alcohol use: No   Drug use: No     Allergies   Lactose and Strawberry extract   Review of Systems Review of Systems Per HPI  Physical Exam Triage Vital Signs ED Triage Vitals  Encounter Vitals Group     BP 10/20/24 0837 124/84     Girls Systolic BP Percentile --      Girls Diastolic BP Percentile --      Boys Systolic BP Percentile --      Boys Diastolic BP Percentile --      Pulse Rate 10/20/24 0837 82     Resp 10/20/24 0837 18     Temp 10/20/24 0837 98.9 F (37.2 C)     Temp Source 10/20/24 0837 Oral     SpO2 10/20/24 0837 98 %     Weight 10/20/24 0836 140 lb  (63.5 kg)     Height 10/20/24 0836 5' (1.524 m)     Head Circumference --      Peak Flow --      Pain Score 10/20/24 0833 6     Pain Loc --      Pain Education --      Exclude from Growth Chart --    No data found.  Updated Vital Signs BP 124/84 (BP Location: Left Arm)   Pulse 82   Temp 98.9 F (37.2 C) (Oral)   Resp 18   Ht 5' (1.524 m)   Wt 140 lb (63.5 kg)   LMP  (LMP Unknown)   SpO2 98%   BMI 27.34 kg/m   Visual Acuity Right Eye Distance:   Left Eye Distance:   Bilateral Distance:    Right Eye Near:   Left Eye Near:    Bilateral Near:     Physical Exam Constitutional:      General: She is not in acute distress.    Appearance: Normal appearance. She is not toxic-appearing or diaphoretic.  HENT:     Head: Normocephalic and atraumatic.  Eyes:     Extraocular Movements: Extraocular movements intact.     Conjunctiva/sclera: Conjunctivae normal.  Cardiovascular:     Rate and Rhythm: Normal rate and regular rhythm.     Pulses: Normal pulses.     Heart sounds: Normal heart sounds.  Pulmonary:     Effort: Pulmonary effort is normal. No respiratory distress.     Breath sounds: Normal breath sounds.  Chest:       Comments: Tenderness to palpation to left upper chest. Neurological:     General: No focal deficit present.     Mental Status: She is alert and oriented to person, place, and time. Mental status is at baseline.  Psychiatric:        Mood and Affect: Mood normal.        Behavior: Behavior normal.        Thought Content: Thought content normal.        Judgment: Judgment normal.      UC Treatments / Results  Labs (all labs ordered are listed, but only abnormal results are displayed) Labs Reviewed  POCT URINE PREGNANCY - Normal  CBC  BASIC METABOLIC PANEL WITH GFR  MAGNESIUM    EKG   Radiology DG Chest 2 View Result Date: 10/20/2024 CLINICAL DATA:  Chest pain since last night. EXAM: CHEST - 2 VIEW COMPARISON:  05/08/2017 FINDINGS: The heart  size and mediastinal contours  are within normal limits. Both lungs are clear. The visualized skeletal structures are unremarkable. Metallic structure over the midline posterior upper thorax external to the patient likely related to clothing. IMPRESSION: No active cardiopulmonary disease. Electronically Signed   By: Toribio Agreste M.D.   On: 10/20/2024 10:06    Procedures Procedures (including critical care time)  Medications Ordered in UC Medications - No data to display  Initial Impression / Assessment and Plan / UC Course  I have reviewed the triage vital signs and the nursing notes.  Pertinent labs & imaging results that were available during my care of the patient were reviewed by me and considered in my medical decision making (see chart for details).     EKG showing sinus rhythm with sinus arrhythmia.  No prior EGG's for comparison.  This could be normal variant due to patient's age.  She is not complaining of any palpitations which is reassuring, therefore do not think that ER evaluation is necessary at this time as HEAR score is 0.  Chest x-ray completed that was negative for any acute cardiopulmonary process.  Will obtain BMP, CBC, mag.  Awaiting results.  Suspect costochondritis versus simple musculoskeletal chest pain given pain is reproducible with palpation.  Advised patient of over-the-counter analgesics as needed for pain.  Discussed ibuprofen  can be helpful but limiting its use given that she takes Lexapro . May take tylenol .  Advised strict ER precautions if chest pain persists or worsens.  Also recommended PCP follow-up and cardiology follow-up.  Patient was provided with contact information for cardiology.  Patient verbalized understanding and was agreeable with plan. Final Clinical Impressions(s) / UC Diagnoses   Final diagnoses:  Chest pain, unspecified type  Urine pregnancy test negative     Discharge Instructions      I suspect that your chest pain is musculoskeletal  in nature.  Recommend Tylenol  and/or ibuprofen  as needed for pain.  Please use ibuprofen  sparingly given that you take Lexapro .  Go to the ER if chest pain persists or worsens.  Follow-up with your primary care doctor.  Blood work is pending.  We will call if it is abnormal.    ED Prescriptions   None    PDMP not reviewed this encounter.   Hazen Darryle BRAVO, [place] 10/20/24 1036

## 2024-10-20 NOTE — ED Triage Notes (Signed)
 Patient reports having sharp chest pain that started about 630-7pm last night, lasting 3-4 hrs. Went to bed after and no pain through the night but woke up with the pain again. No sob. No trouble breathing.

## 2024-10-21 DIAGNOSIS — M9903 Segmental and somatic dysfunction of lumbar region: Secondary | ICD-10-CM | POA: Diagnosis not present

## 2024-10-21 DIAGNOSIS — M9902 Segmental and somatic dysfunction of thoracic region: Secondary | ICD-10-CM | POA: Diagnosis not present

## 2024-10-21 DIAGNOSIS — M9905 Segmental and somatic dysfunction of pelvic region: Secondary | ICD-10-CM | POA: Diagnosis not present

## 2024-10-27 ENCOUNTER — Ambulatory Visit (HOSPITAL_COMMUNITY): Payer: Self-pay

## 2024-10-27 LAB — CBC
Hematocrit: 45.9 % (ref 34.0–46.6)
Hemoglobin: 14.7 g/dL (ref 11.1–15.9)
MCH: 28.5 pg (ref 26.6–33.0)
MCHC: 32 g/dL (ref 31.5–35.7)
MCV: 89 fL (ref 79–97)
Platelets: 291 x10E3/uL (ref 150–450)
RBC: 5.16 x10E6/uL (ref 3.77–5.28)
RDW: 12.6 % (ref 11.7–15.4)
WBC: 7 x10E3/uL (ref 3.4–10.8)

## 2024-10-27 LAB — BASIC METABOLIC PANEL WITH GFR
BUN/Creatinine Ratio: 8 — ABNORMAL LOW (ref 9–23)
BUN: 6 mg/dL (ref 6–20)
Calcium: 10.1 mg/dL (ref 8.7–10.2)
Chloride: 106 mmol/L (ref 96–106)
Creatinine, Ser: 0.77 mg/dL (ref 0.57–1.00)
Glucose: 76 mg/dL (ref 70–99)
Potassium: 4 mmol/L (ref 3.5–5.2)
Sodium: 140 mmol/L (ref 134–144)
eGFR: 112 mL/min/1.73 (ref >59)

## 2024-10-27 LAB — MAGNESIUM: Magnesium: 2.1 mg/dL (ref 1.6–2.3)

## 2024-11-04 DIAGNOSIS — M9902 Segmental and somatic dysfunction of thoracic region: Secondary | ICD-10-CM | POA: Diagnosis not present

## 2024-11-04 DIAGNOSIS — M9903 Segmental and somatic dysfunction of lumbar region: Secondary | ICD-10-CM | POA: Diagnosis not present

## 2024-11-04 DIAGNOSIS — M9905 Segmental and somatic dysfunction of pelvic region: Secondary | ICD-10-CM | POA: Diagnosis not present
